# Patient Record
Sex: Female | Born: 1960 | Race: Black or African American | Smoking: Never smoker
Health system: Southern US, Community
[De-identification: ages and names within clinical notes are randomized; demographics above are authoritative.]

## PROBLEM LIST (undated history)

## (undated) DIAGNOSIS — M6250 Muscle wasting and atrophy, not elsewhere classified, unspecified site: Secondary | ICD-10-CM

## (undated) DIAGNOSIS — M5416 Radiculopathy, lumbar region: Secondary | ICD-10-CM

## (undated) DIAGNOSIS — M5136 Other intervertebral disc degeneration, lumbar region: Secondary | ICD-10-CM

## (undated) DIAGNOSIS — M5412 Radiculopathy, cervical region: Secondary | ICD-10-CM

## (undated) DIAGNOSIS — F319 Bipolar disorder, unspecified: Secondary | ICD-10-CM

## (undated) DIAGNOSIS — F32A Depression, unspecified: Secondary | ICD-10-CM

## (undated) DIAGNOSIS — M545 Low back pain, unspecified: Secondary | ICD-10-CM

## (undated) DIAGNOSIS — M25561 Pain in right knee: Secondary | ICD-10-CM

## (undated) DIAGNOSIS — J45909 Unspecified asthma, uncomplicated: Secondary | ICD-10-CM

## (undated) DIAGNOSIS — G8929 Other chronic pain: Secondary | ICD-10-CM

## (undated) DIAGNOSIS — M79671 Pain in right foot: Secondary | ICD-10-CM

## (undated) DIAGNOSIS — I10 Essential (primary) hypertension: Secondary | ICD-10-CM

## (undated) HISTORY — DX: Radiculopathy, cervical region: M54.12

## (undated) HISTORY — DX: Other chronic pain: G89.29

## (undated) HISTORY — DX: Pain in right foot: M79.671

## (undated) HISTORY — DX: Other intervertebral disc degeneration, lumbar region: M51.36

## (undated) HISTORY — PX: MYOMECTOMY: SHX85

## (undated) HISTORY — DX: Muscle wasting and atrophy, not elsewhere classified, unspecified site: M62.50

## (undated) HISTORY — PX: ROTATOR CUFF REPAIR: SHX139

## (undated) HISTORY — DX: Radiculopathy, lumbar region: M54.16

## (undated) HISTORY — PX: TONSILLECTOMY: SUR1361

## (undated) HISTORY — DX: Pain in right knee: M25.561

## (undated) HISTORY — DX: Depression, unspecified: F32.A

## (undated) HISTORY — DX: Low back pain, unspecified: M54.50

---

## 2012-01-22 DIAGNOSIS — F319 Bipolar disorder, unspecified: Secondary | ICD-10-CM | POA: Insufficient documentation

## 2012-01-22 DIAGNOSIS — J069 Acute upper respiratory infection, unspecified: Secondary | ICD-10-CM | POA: Insufficient documentation

## 2012-01-22 DIAGNOSIS — I10 Essential (primary) hypertension: Secondary | ICD-10-CM | POA: Insufficient documentation

## 2012-01-22 DIAGNOSIS — R600 Localized edema: Secondary | ICD-10-CM | POA: Insufficient documentation

## 2012-08-06 DIAGNOSIS — N920 Excessive and frequent menstruation with regular cycle: Secondary | ICD-10-CM | POA: Insufficient documentation

## 2016-05-17 DIAGNOSIS — M2550 Pain in unspecified joint: Secondary | ICD-10-CM | POA: Insufficient documentation

## 2016-10-20 DIAGNOSIS — R072 Precordial pain: Secondary | ICD-10-CM | POA: Insufficient documentation

## 2017-03-12 DIAGNOSIS — F4310 Post-traumatic stress disorder, unspecified: Secondary | ICD-10-CM | POA: Insufficient documentation

## 2017-09-24 DIAGNOSIS — F324 Major depressive disorder, single episode, in partial remission: Secondary | ICD-10-CM | POA: Insufficient documentation

## 2017-12-31 DIAGNOSIS — D259 Leiomyoma of uterus, unspecified: Secondary | ICD-10-CM | POA: Insufficient documentation

## 2017-12-31 DIAGNOSIS — N9489 Other specified conditions associated with female genital organs and menstrual cycle: Secondary | ICD-10-CM | POA: Insufficient documentation

## 2018-10-09 DIAGNOSIS — M5136 Other intervertebral disc degeneration, lumbar region: Secondary | ICD-10-CM | POA: Insufficient documentation

## 2019-04-08 DIAGNOSIS — E669 Obesity, unspecified: Secondary | ICD-10-CM | POA: Insufficient documentation

## 2019-09-10 DIAGNOSIS — M79671 Pain in right foot: Secondary | ICD-10-CM | POA: Insufficient documentation

## 2019-10-05 DIAGNOSIS — R6883 Chills (without fever): Secondary | ICD-10-CM | POA: Insufficient documentation

## 2019-10-05 DIAGNOSIS — R0602 Shortness of breath: Secondary | ICD-10-CM | POA: Insufficient documentation

## 2019-10-05 DIAGNOSIS — R52 Pain, unspecified: Secondary | ICD-10-CM | POA: Insufficient documentation

## 2019-10-08 DIAGNOSIS — M5416 Radiculopathy, lumbar region: Secondary | ICD-10-CM | POA: Insufficient documentation

## 2019-12-14 DIAGNOSIS — G8929 Other chronic pain: Secondary | ICD-10-CM | POA: Insufficient documentation

## 2020-06-28 DIAGNOSIS — E559 Vitamin D deficiency, unspecified: Secondary | ICD-10-CM | POA: Insufficient documentation

## 2020-06-28 DIAGNOSIS — E7800 Pure hypercholesterolemia, unspecified: Secondary | ICD-10-CM | POA: Insufficient documentation

## 2020-07-19 DIAGNOSIS — M6250 Muscle wasting and atrophy, not elsewhere classified, unspecified site: Secondary | ICD-10-CM | POA: Insufficient documentation

## 2020-08-10 ENCOUNTER — Other Ambulatory Visit: Payer: Self-pay | Admitting: Orthopedic Surgery

## 2020-08-10 DIAGNOSIS — M5416 Radiculopathy, lumbar region: Secondary | ICD-10-CM

## 2020-08-11 ENCOUNTER — Telehealth: Payer: Self-pay

## 2020-08-11 NOTE — Telephone Encounter (Signed)
Called and spoke with patient regarding the order we received at [facility] to do a Discogram. Allergies reviewed. Pt denies any allergies to antibiotics. Pt reports she weighs 235 lbs. Pt also reports she is not on any blood thinners. Tye Maryland, our scheduler, will call patient to schedule.

## 2020-08-16 ENCOUNTER — Ambulatory Visit
Admission: RE | Admit: 2020-08-16 | Discharge: 2020-08-16 | Disposition: A | Discharge status: Home / Self Care | Payer: No Typology Code available for payment source | Source: Ambulatory Visit | Attending: Orthopedic Surgery | Admitting: Orthopedic Surgery

## 2020-08-16 DIAGNOSIS — M5416 Radiculopathy, lumbar region: Secondary | ICD-10-CM

## 2020-08-16 MED ORDER — METHYLPREDNISOLONE ACETATE 40 MG/ML INJ SUSP (RADIOLOG
120.0000 mg | Freq: Once | INTRAMUSCULAR | Status: AC
  Administered 2020-08-16: 120 mg via INTRALESIONAL

## 2020-08-16 MED ORDER — IOPAMIDOL (ISOVUE-M 200) INJECTION 41%
1.0000 mL | Freq: Once | INTRAMUSCULAR | Status: AC
  Administered 2020-08-16: 1 mL

## 2020-08-16 MED ORDER — CEFAZOLIN SODIUM-DEXTROSE 2-4 GM/100ML-% IV SOLN
2.0000 g | INTRAVENOUS | Status: AC
  Administered 2020-08-16: 2 g via INTRAVENOUS

## 2020-08-16 NOTE — Discharge Instructions (Signed)
Intra-Discal Anesthetic Injection Discharge Instruction Sheet  1. You may resume a regular diet and any medications that you routinely take (including pain medications).  2. No driving day of procedure.  3. Light activity throughout the rest of the day.  Do not do any strenuous work, exercise, bending or lifting.  The day following the procedure, you can resume normal physical activity but you should refrain from exercising or physical therapy for at least three days thereafter.   Please contact our office at [phone] for the following symptoms:  Fever greater than 100 degrees. Headaches unresolved with medication after 2-3 days.Post Procedure Spinal Discharge Instruction Sheet  You may resume a regular diet and any medications that you routinely take (including pain medications).  No driving day of procedure.  Light activity throughout the rest of the day.  Do not do any strenuous work, exercise, bending or lifting.  The day following the procedure, you can resume normal physical activity but you should refrain from exercising or physical therapy for at least three days thereafter.   Common Side Effects:  Headaches- take your usual medications as directed by your physician.  Increase your fluid intake.  Caffeinated beverages may be helpful.  Lie flat in bed until your headache resolves.  Restlessness or inability to sleep- you may have trouble sleeping for the next few days.  Ask your referring physician if you need any medication for sleep.  Facial flushing or redness- should subside within a few days.  Increased pain- a temporary increase in pain a day or two following your procedure is not unusual.  Take your pain medication as prescribed by your referring physician.  Leg cramps  Please contact our office at [phone] for the following symptoms: Fever greater than 100 degrees. Headaches unresolved with medication after 2-3 days.  Increased swelling, pain, or redness at  injection site.

## 2020-09-16 ENCOUNTER — Ambulatory Visit: Payer: Self-pay | Admitting: Orthopedic Surgery

## 2020-09-26 ENCOUNTER — Other Ambulatory Visit: Payer: Self-pay

## 2020-10-03 ENCOUNTER — Ambulatory Visit: Payer: Self-pay | Admitting: Orthopedic Surgery

## 2020-10-03 NOTE — H&P (Signed)
Subjective:   Joanna Sweeney is a pleasant 60 year old female who was in her normal state of health until unfortunately she was involved in a worker comp injury On 06/17/2020. Her chief complaint is low back pain. Thus far she has failed appropriate conservative treatment. Recent L5-S1 intradiscal injection gave her greater than 80% relief of her symptoms For a short period of time essentially identifying this disc as her primary pain generator. After discussing risks and benefits of surgical intervention, she has elected to move forward with surgery she is scheduled for ALIF L5-S1, Left Iliac crest bone graft. However, after further consideration Dr. Rolena Infante has decided to add posterior pedicle screw fixation for additional support given the patient's body habitus and size. We will still plan on moving forward with the surgery at [facility] on 02/10.  Patient Active Problem List   Diagnosis Date Noted  . Muscle atrophy 07/19/2020  . Pure hypercholesterolemia 06/28/2020  . Vitamin D deficiency 06/28/2020  . Chronic ankle pain 12/14/2019  . Lumbar radiculopathy 10/08/2019  . Chills 10/05/2019  . Generalized body aches 10/05/2019  . Shortness of breath 10/05/2019  . Pain in right foot 09/10/2019  . Obesity (BMI 30-39.9) 04/08/2019  . Degeneration of lumbar intervertebral disc 10/09/2018  . Endometrial mass 12/31/2017  . Uterine leiomyoma 12/31/2017  . Major depressive disorder with single episode, in partial remission ([place]) 09/24/2017  . Posttraumatic stress disorder 03/12/2017  . Precordial chest pain 10/20/2016  . Arthralgia 05/17/2016  . Menorrhagia 08/06/2012  . Benign essential HTN 01/22/2012  . Bipolar disorder ([place]) 01/22/2012  . Edema of lower extremity 01/22/2012  . URI (upper respiratory infection) 01/22/2012   Past Medical History:  Diagnosis Date  . Cervical radiculopathy   . Chronic ankle pain   . Degeneration of lumbar intervertebral disc   . Lumbar radiculopathy   . Lumbar spine  pain   . Muscle atrophy   . Pain in right foot   . Pain in right knee      Current Outpatient Medications  Medication Sig Dispense Refill Last Dose  . albuterol (ACCUNEB) 0.63 MG/3ML nebulizer solution Take 1 ampule by nebulization every 6 (six) hours as needed for wheezing.     Marland Kitchen albuterol (VENTOLIN HFA) 108 (90 Base) MCG/ACT inhaler albuterol sulfate HFA 90 mcg/actuation aerosol inhaler     . amoxicillin (AMOXIL) 500 MG tablet Take 1,000 mg by mouth 2 (two) times daily.     . budesonide-formoterol (SYMBICORT) 80-4.5 MCG/ACT inhaler Inhale 2 puffs into the lungs 2 (two) times daily.     Marland Kitchen buPROPion (WELLBUTRIN XL) 150 MG 24 hr tablet Take 150 mg by mouth 2 (two) times daily.     . cloNIDine (CATAPRES) 0.1 MG tablet Take by mouth.     . diazepam (VALIUM) 5 MG tablet Take 5 mg by mouth every 6 (six) hours as needed for anxiety.     . diclofenac (VOLTAREN) 75 MG EC tablet Take by mouth.     . doxepin (SINEQUAN) 10 MG capsule Take 10 mg by mouth.     . gabapentin (NEURONTIN) 300 MG capsule Take 300 mg by mouth 3 (three) times daily.     . hydrochlorothiazide (HYDRODIURIL) 25 MG tablet Take by mouth.     . lamoTRIgine (LAMICTAL) 100 MG tablet Take 100 mg by mouth daily.     Marland Kitchen lidocaine (LIDODERM) 5 % Place 1 patch onto the skin daily. Remove & Discard patch within 12 hours or as directed by MD     .  methocarbamol (ROBAXIN) 500 MG tablet Take 500 mg by mouth 4 (four) times daily.     . misoprostol (CYTOTEC) 200 MCG tablet Take 200 mcg by mouth 4 (four) times daily.     . Multiple Vitamin (THERA) TABS Take 1 tablet by mouth daily.     . Phentermine-Topiramate (QSYMIA) 7.5-46 MG CP24 Qsymia 7.5 mg-46 mg capsule, extended release  TAKE 1 CAPSULE BY MOUTH EVERY DAY     . promethazine (PHENERGAN) 25 MG tablet Take 25 mg by mouth every 6 (six) hours as needed for nausea or vomiting.     . sertraline (ZOLOFT) 100 MG tablet Take 100 mg by mouth daily.     . traZODone (DESYREL) 100 MG tablet Take by  mouth.     . Vitamin D, Ergocalciferol, (DRISDOL) 1.25 MG (50000 UNIT) CAPS capsule Take 50,000 Units by mouth every 7 (seven) days.      No current facility-administered medications for this visit.   No Known Allergies  Social History   Tobacco Use  . Smoking status: Not on file  . Smokeless tobacco: Not on file  Substance Use Topics  . Alcohol use: Not on file    No family history on file.  Review of Systems Pertinent items are noted in HPI.  Objective:   Vitals: Ht: 6 ft 10/03/2020 01:28 pm BP: 130/87 L arm 10/03/2020 01:31 pm Pulse: 93 bpm 10/03/2020 01:31 pm  Clinical exam: Patient is alert and oriented 3. No shortness of breath, chest pain.  Heart: Regular rate and rhythm, no rubs, murmurs, or gallops  Lungs: Clear to auscultation bilaterally  Abdomen: Soft and nontender. No loss of bowel and bladder control, no rebound tenderness  Lumbar spine: Significant low back pain with palpation and range of motion especially forward flexion and rotation. No SI joint pain with direct palpation. Negative Patrick's test.  Neuro: 5/5 motor strength in the lower extremity bilaterally. Occasional dysesthesias into the lower extremity. Negative straight leg raise test  Imaging studies: Lumbar MRI: completed on 07/06/20 was reviewed with the patient. [facility] was completed at [facility]; I have independently reviewed the images as well as the radiology report. Degenerative lumbar disc disease L5-S1 with a left-sided disc protrusion causing moderate displacement of the descending left S1 nerve root. Positive endplate signal changes noted. No fracture or abnormal marrow signal change. There is significant atrophy within the multifidus muscles L2-S1.  Patient reports temporary significant improvement following the L5-S1 intradiscal injection. She states her pain went from a 9 to a 4/5.  Assessment:   At this point time we have had a discussion about her chronic pain. She is had this issue  for more than 6 months and it is significantly impacting her quality-of-life. Based on the intradiscal injection I believe her primary issue is the degenerative disc disease L5-S1. While she does have a posterior lateral disc protrusion she is not complaining of significant S1 radicular pain, nor does she have any focal neurological deficits.  Plan:   Treatment plan: At this point time the only treatment option I have left offer would be an L5-S1 fusion. This would remove the painful disc and reduce her back pain. I have gone over the surgical procedure in great detail with the patient and her mother and all their questions were encouraged and addressed.  Risks and benefits of surgery were discussed with the patient. These include: Infection, bleeding, death, stroke, paralysis, ongoing or worse pain, need for additional surgery, nonunion, leak of spinal fluid, adjacent segment  degeneration requiring additional fusion surgery, need for posterior decompression and/or fusion. Bleeding from major vessels, and blood clots (deep venous thrombosis)requiring additional treatment, loss in bowel and bladder control, injury to bladder, ureters, and other major abdominal organs.  We have received preoperative medical clearance from the patient's primary care provider who is recommending the patient be seen by cardiologist which is scheduled for February 7. Patient is also scheduled to see Dr. Tawni Millers on February 7. Patient does have her LSO brace which was provided by work comp.  I have reviewed the patient's medication list. She is not on any blood thinners. She is not taking the aspirin or using any anti-inflammatory medications. I have advised her to avoid over-the-counter anti-inflammatory medications one week prior to surgery as well as 6 weeks out.  We have also discussed the post-operative recovery period to include: bathing/showering restrictions, wound healing, activity (and driving) restrictions,  medications/pain mangement.  We have also discussed post-operative redflags to include: signs and symptoms of postoperative infection, DVT/PE.  Follow-up: 2 weeks postop    Addendum: Patient was scheduled for an anterior L5/S1 fusion. Due to her body habitus and size I elected to move forward with an anterior L5-S1 fusion with supplemental posterior pedicle screw fixation in order to improve her overall fusion rate. I have gone over the surgical procedure with the patient and her son and all their questions were addressed. We have addressed the risks benefits and alternatives. Risks and benefits of surgery were discussed with the patient. These include: Infection, bleeding, death, stroke, paralysis, ongoing or worse pain, need for additional surgery, nonunion, leak of spinal fluid, adjacent segment degeneration requiring additional fusion surgery, need for posterior decompression and/or fusion. Bleeding from major vessels, and blood clots (deep venous thrombosis)requiring additional treatment, loss in bowel and bladder control, injury to bladder, ureters, and other major abdominal organs. Risks and benefits of spinal fusion: Infection, bleeding, death, stroke, paralysis, ongoing or worse pain, need for additional surgery, nonunion, leak of spinal fluid, adjacent segment degeneration requiring additional fusion surgery, Injury to abdominal vessels that can require anterior surgery to stop bleeding. Malposition of the cage and/or pedicle screws that could require additional surgery. Loss of bowel and bladder control. Postoperative hematoma causing neurologic compression that could require urgent or emergent re-operation.

## 2020-10-10 ENCOUNTER — Ambulatory Visit (INDEPENDENT_AMBULATORY_CARE_PROVIDER_SITE_OTHER): Payer: No Typology Code available for payment source | Admitting: Vascular Surgery

## 2020-10-10 ENCOUNTER — Encounter: Payer: Self-pay | Admitting: Vascular Surgery

## 2020-10-10 ENCOUNTER — Other Ambulatory Visit: Payer: Self-pay

## 2020-10-10 VITALS — BP 127/89 | HR 72 | Temp 98.1°F | Ht 72.0 in | Wt 244.2 lb

## 2020-10-10 DIAGNOSIS — M5137 Other intervertebral disc degeneration, lumbosacral region: Secondary | ICD-10-CM

## 2020-10-10 NOTE — Progress Notes (Signed)
[facility] [place]  Patient name: Surya Folden MRN: [id number] DOB: Jun 23, 1961 Sex: female  REASON FOR CONSULT evaluation for anterior exposure for L5-S1 fusion  HPI: Callia Swim is a 60 y.o. female, who is here today for discussion of anterior exposure for L5-S1 fusion with Dr. Rolena Infante.  She is here today with her son.  She has had several year history of progressive lower back and bilateral lower extremity discomfort.  She has failed conservative treatment with the spine injections.  She has been seen in consultation with Dr. Rolena Infante who is recommended anterior fusion and posterior pedicle screws.  Past Medical History:  Diagnosis Date  . Cervical radiculopathy   . Chronic ankle pain   . Degeneration of lumbar intervertebral disc   . Depression   . Lumbar radiculopathy   . Lumbar spine pain   . Muscle atrophy   . Pain in right foot   . Pain in right knee     History reviewed. No pertinent family history.  SOCIAL HISTORY: Social History   Socioeconomic History  . Marital status: Unknown    Spouse name: Not on file  . Number of children: Not on file  . Years of education: Not on file  . Highest education level: Not on file  Occupational History  . Not on file  Tobacco Use  . Smoking status: Never Smoker  . Smokeless tobacco: Never Used  Substance and Sexual Activity  . Alcohol use: Not on file  . Drug use: Not on file  . Sexual activity: Not on file  Other Topics Concern  . Not on file  Social History Narrative  . Not on file   Social Determinants of Health   Financial Resource Strain: Not on file  Food Insecurity: Not on file  Transportation Needs: Not on file  Physical Activity: Not on file  Stress: Not on file  Social Connections: Not on file  Intimate Partner Violence: Not on file    No Known Allergies  Current Outpatient Medications  Medication Sig Dispense Refill  . albuterol (ACCUNEB)  0.63 MG/3ML nebulizer solution Take 1 ampule by nebulization every 6 (six) hours as needed for wheezing.    Marland Kitchen albuterol (VENTOLIN HFA) 108 (90 Base) MCG/ACT inhaler albuterol sulfate HFA 90 mcg/actuation aerosol inhaler    . amoxicillin (AMOXIL) 500 MG tablet Take 1,000 mg by mouth 2 (two) times daily.    Marland Kitchen buPROPion (WELLBUTRIN XL) 150 MG 24 hr tablet Take 150 mg by mouth 2 (two) times daily.    . cloNIDine (CATAPRES) 0.1 MG tablet Take by mouth.    . diazepam (VALIUM) 5 MG tablet Take 5 mg by mouth every 6 (six) hours as needed for anxiety.    . gabapentin (NEURONTIN) 300 MG capsule Take 300 mg by mouth 3 (three) times daily.    . hydrochlorothiazide (HYDRODIURIL) 25 MG tablet Take by mouth.    . lamoTRIgine (LAMICTAL) 100 MG tablet Take 100 mg by mouth daily.    Marland Kitchen lidocaine (LIDODERM) 5 % Place 1 patch onto the skin daily. Remove & Discard patch within 12 hours or as directed by MD    . misoprostol (CYTOTEC) 200 MCG tablet Take 200 mcg by mouth 4 (four) times daily.    . Multiple Vitamin (THERA) TABS Take 1 tablet by mouth daily.    . Phentermine-Topiramate (QSYMIA) 7.5-46 MG CP24 Qsymia 7.5 mg-46 mg capsule, extended release  TAKE 1 CAPSULE BY MOUTH EVERY DAY    .  sertraline (ZOLOFT) 100 MG tablet Take 100 mg by mouth daily.    . traZODone (DESYREL) 100 MG tablet Take by mouth.    . budesonide-formoterol (SYMBICORT) 80-4.5 MCG/ACT inhaler Inhale 2 puffs into the lungs 2 (two) times daily. (Patient not taking: Reported on 10/10/2020)    . diclofenac (VOLTAREN) 75 MG EC tablet Take by mouth. (Patient not taking: Reported on 10/10/2020)    . doxepin (SINEQUAN) 10 MG capsule Take 10 mg by mouth. (Patient not taking: Reported on 10/10/2020)    . methocarbamol (ROBAXIN) 500 MG tablet Take 500 mg by mouth 4 (four) times daily. (Patient not taking: Reported on 10/10/2020)    . promethazine (PHENERGAN) 25 MG tablet Take 25 mg by mouth every 6 (six) hours as needed for nausea or vomiting. (Patient not taking:  Reported on 10/10/2020)    . Vitamin D, Ergocalciferol, (DRISDOL) 1.25 MG (50000 UNIT) CAPS capsule Take 50,000 Units by mouth every 7 (seven) days. (Patient not taking: Reported on 10/10/2020)     No current facility-administered medications for this visit.    REVIEW OF SYSTEMS:  [X]  denotes positive finding, [ ]  denotes negative finding Cardiac  Comments:  Chest pain or chest pressure:    Shortness of breath upon exertion:    Short of breath when lying flat:    Irregular heart rhythm:        Vascular    Pain in calf, thigh, or hip brought on by ambulation:    Pain in feet at night that wakes you up from your sleep:     Blood clot in your veins:    Leg swelling:         Pulmonary    Oxygen at home:    Productive cough:     Wheezing:         Neurologic    Sudden weakness in arms or legs:     Sudden numbness in arms or legs:     Sudden onset of difficulty speaking or slurred speech:    Temporary loss of vision in one eye:     Problems with dizziness:         Gastrointestinal    Blood in stool:     Vomited blood:         Genitourinary    Burning when urinating:     Blood in urine:        Psychiatric    Major depression:  x       Hematologic    Bleeding problems:    Problems with blood clotting too easily:        Skin    Rashes or ulcers:        Constitutional    Fever or chills:      PHYSICAL EXAM: Vitals:   10/10/20 1535  BP: 127/89  Pulse: 72  Temp: 98.1 F (36.7 C)  TempSrc: Skin  SpO2: 95%  Weight: 244 lb 3.2 oz (110.8 kg)  Height: 6' (1.829 m)    GENERAL: The patient is a well-nourished female, in no acute distress. The vital signs are documented above. CARDIOVASCULAR: 2+ radial 2+ popliteal and 2+ dorsalis pedis pulses bilaterally.  Carotid arteries without bruits bilaterally.  Abdomen soft with no masses noted PULMONARY: There is good air exchange  MUSCULOSKELETAL: There are no major deformities or cyanosis. NEUROLOGIC: No focal weakness or  paresthesias are detected. SKIN: There are no ulcers or rashes noted. PSYCHIATRIC: The patient has a normal affect.  DATA:  Discogram images  were reviewed from 08/16/2020.  This showed no evidence of calcification of the aortoiliac segments  MEDICAL ISSUES: Failed conservative treatment of severe degenerative disc disease lower back.  She has seen Dr. Rolena Infante who is recommended fusion for treatment.  I explained my role in exposure.  I explained the location of the incision and retroperitoneal exposure with mobilization of the ureter and intraperitoneal contents.  Also discussed mobilization of the arterial and venous structures overlying the spine with potential of injury of all these structures.  I specifically did discuss the risk of potential major venous injury.  I feel that she is an acceptable risk for surgery. She is not morbidly obese She has had no prior abdominal surgery She has no evidence of aortoiliac occlusive disease  We plan on proceeding on 10/26/2020 as planned   Rosetta Posner, MD [facility] [phone] Pager [phone]  Note: Portions of this report may have been transcribed using voice recognition software.  Every effort has been made to ensure accuracy; however, inadvertent computerized transcription errors may still be present.

## 2020-10-11 ENCOUNTER — Other Ambulatory Visit: Payer: Self-pay

## 2020-10-21 NOTE — Pre-Procedure Instructions (Signed)
Jaeline Whobrey  10/21/2020      [id number] - [place], [place] - [address] [place] [address] Phone: [phone] Fax: [phone]    Your procedure is scheduled on Feb. 23  Report to [facility]  Entrance A at 5:30 A.M.  Call this number if you have problems the morning of surgery:  [phone]   Remember:  Do not eat or drink after midnight.      Take these medicines the morning of surgery with A SIP OF WATER:              Albuterol inhaler if needed             symbicort --bring to hospital             Diazepam (valium) if needed             Doxepin (sinequan)             Methocarbamol (robaxin)             Phenergan if needed            Sertraline (zoloft)                    7 days prior to surgery STOP taking any Aspirin (unless otherwise instructed by your surgeon), Aleve, Naproxen, Ibuprofen, Motrin, Advil, Goody's, BC's, all herbal medications, fish oil, and all vitamins. Stop diclofenac (voltaren)                  Do not wear jewelry, make-up or nail polish.  Do not wear lotions, powders, or perfumes, or deodorant.  Do not shave 48 hours prior to surgery.  Men may shave face and neck.  Do not bring valuables to the hospital.  [facility] is not responsible for any belongings or valuables.  Contacts, dentures or bridgework may not be worn into surgery.  Leave your suitcase in the car.  After surgery it may be brought to your room.  For patients admitted to the hospital, discharge time will be determined by your treatment team.  Patients discharged the day of surgery will not be allowed to drive home.    Special instructions:   [place]- Preparing For Surgery  Before surgery, you can play an important role. Because skin is not sterile, your skin needs to be as free of germs as possible. You can reduce the number of germs on your skin by washing with CHG  (chlorahexidine gluconate) Soap before surgery.  CHG is an antiseptic cleaner which kills germs and bonds with the skin to continue killing germs even after washing.    Oral Hygiene is also important to reduce your risk of infection.  Remember - BRUSH YOUR TEETH THE MORNING OF SURGERY WITH YOUR REGULAR TOOTHPASTE  Please do not use if you have an allergy to CHG or antibacterial soaps. If your skin becomes reddened/irritated stop using the CHG.  Do not shave (including legs and underarms) for at least 48 hours prior to first CHG shower. It is OK to shave your face.  Please follow these instructions carefully.   1. Shower the NIGHT BEFORE SURGERY and the MORNING OF SURGERY with CHG.   2. If you chose to wash your hair, wash your hair first as usual with your normal shampoo.  3. After you shampoo, rinse your hair and body thoroughly to  remove the shampoo.  4. Use CHG as you would any other liquid soap. You can apply CHG directly to the skin and wash gently with a scrungie or a clean washcloth.   5. Apply the CHG Soap to your body ONLY FROM THE NECK DOWN.  Do not use on open wounds or open sores. Avoid contact with your eyes, ears, mouth and genitals (private parts). Wash Face and genitals (private parts)  with your normal soap.  6. Wash thoroughly, paying special attention to the area where your surgery will be performed.  7. Thoroughly rinse your body with warm water from the neck down.  8. DO NOT shower/wash with your normal soap after using and rinsing off the CHG Soap.  9. Pat yourself dry with a CLEAN TOWEL.  10. Wear CLEAN PAJAMAS to bed the night before surgery, wear comfortable clothes the morning of surgery  11. Place CLEAN SHEETS on your bed the night of your first shower and DO NOT SLEEP WITH PETS.    Day of Surgery:  Do not apply any deodorants/lotions.  Please wear clean clothes to the hospital/surgery center.   Remember to brush your teeth WITH YOUR REGULAR  TOOTHPASTE.    Please read over the following fact sheets that you were given.

## 2020-10-24 ENCOUNTER — Encounter (HOSPITAL_COMMUNITY)
Admission: RE | Admit: 2020-10-24 | Discharge: 2020-10-24 | Disposition: A | Discharge status: Home / Self Care | Payer: No Typology Code available for payment source | Source: Ambulatory Visit | Attending: Orthopedic Surgery | Admitting: Orthopedic Surgery

## 2020-10-24 ENCOUNTER — Other Ambulatory Visit (HOSPITAL_COMMUNITY): Payer: BC Managed Care – PPO

## 2020-10-24 ENCOUNTER — Other Ambulatory Visit: Payer: Self-pay

## 2020-10-24 ENCOUNTER — Encounter (HOSPITAL_COMMUNITY): Payer: Self-pay

## 2020-10-24 ENCOUNTER — Ambulatory Visit (HOSPITAL_COMMUNITY)
Admission: RE | Admit: 2020-10-24 | Discharge: 2020-10-24 | Disposition: A | Discharge status: Home / Self Care | Payer: No Typology Code available for payment source | Source: Ambulatory Visit | Attending: Orthopedic Surgery | Admitting: Orthopedic Surgery

## 2020-10-24 DIAGNOSIS — Z01818 Encounter for other preprocedural examination: Secondary | ICD-10-CM

## 2020-10-24 HISTORY — DX: Essential (primary) hypertension: I10

## 2020-10-24 HISTORY — DX: Unspecified asthma, uncomplicated: J45.909

## 2020-10-24 HISTORY — DX: Bipolar disorder, unspecified: F31.9

## 2020-10-24 LAB — CBC
HCT: 38.8 % (ref 36.0–46.0)
Hemoglobin: 12.1 g/dL (ref 12.0–15.0)
MCH: 30 pg (ref 26.0–34.0)
MCHC: 31.2 g/dL (ref 30.0–36.0)
MCV: 96.3 fL (ref 80.0–100.0)
Platelets: 241 10*3/uL (ref 150–400)
RBC: 4.03 MIL/uL (ref 3.87–5.11)
RDW: 12.3 % (ref 11.5–15.5)
WBC: 6.1 10*3/uL (ref 4.0–10.5)
nRBC: 0 % (ref 0.0–0.2)

## 2020-10-24 LAB — BASIC METABOLIC PANEL
Anion gap: 8 (ref 5–15)
BUN: 6 mg/dL (ref 6–20)
CO2: 29 mmol/L (ref 22–32)
Calcium: 9.1 mg/dL (ref 8.9–10.3)
Chloride: 104 mmol/L (ref 98–111)
Creatinine, Ser: 0.88 mg/dL (ref 0.44–1.00)
GFR, Estimated: >60 mL/min (ref >60)
Glucose, Bld: 105 mg/dL — ABNORMAL HIGH (ref 70–99)
Potassium: 3.6 mmol/L (ref 3.5–5.1)
Sodium: 141 mmol/L (ref 135–145)

## 2020-10-24 LAB — TYPE AND SCREEN
ABO/RH(D): B POS
Antibody Screen: NEGATIVE

## 2020-10-24 LAB — APTT: aPTT: 27 seconds (ref 24–36)

## 2020-10-24 LAB — URINALYSIS, ROUTINE W REFLEX MICROSCOPIC
Bilirubin Urine: NEGATIVE
Glucose, UA: NEGATIVE mg/dL
Hgb urine dipstick: NEGATIVE
Ketones, ur: NEGATIVE mg/dL
Leukocytes,Ua: NEGATIVE
Nitrite: NEGATIVE
Protein, ur: NEGATIVE mg/dL
Specific Gravity, Urine: 1.012 (ref 1.005–1.030)
pH: 5 (ref 5.0–8.0)

## 2020-10-24 LAB — SURGICAL PCR SCREEN
MRSA, PCR: NEGATIVE
Staphylococcus aureus: NEGATIVE

## 2020-10-24 LAB — PROTIME-INR
INR: 1 (ref 0.8–1.2)
Prothrombin Time: 13 seconds (ref 11.4–15.2)

## 2020-10-24 NOTE — Progress Notes (Signed)
PCP - [facility]  Cardiologist - Dr. Jenny Reichmann Powers  Chest x-ray - 10/24/20  EKG - 10/10/20 (CE)-req'd  Stress Test - 10/20/20 (CE)  ECHO - 10/18/20 (CE)  Cardiac Cath - Denies  AICD-na PM-na LOOP-na  Sleep Study - Denies CPAP - Denies  LABS- 10/24/20: CBC, BMP, PT, PTT, T/S, UA, PCR, COVID  ASA- Denies  ERAS- No  HA1C- Denies  Anesthesia- Yes- surgical order/ EKG req'd  Pt denies having chest pain, sob, or fever at this time. All instructions explained to the pt, with a verbal understanding of the material. Pt agrees to go over the instructions while at home for a better understanding. Pt also instructed to self quarantine after being tested for COVID-19. The opportunity to ask questions was provided.   Coronavirus Screening  Have you experienced the following symptoms:  Cough yes/no: No Fever (>100.34F)  yes/no: No Runny nose yes/no: No Sore throat yes/no: No Difficulty breathing/shortness of breath  yes/no: No  Have you or a family member traveled in the last 14 days and where? yes/no: No   If the patient indicates "YES" to the above questions, their PAT will be rescheduled to limit the exposure to others and, the surgeon will be notified. THE PATIENT WILL NEED TO BE ASYMPTOMATIC FOR 14 DAYS.   If the patient is not experiencing any of these symptoms, the PAT nurse will instruct them to NOT bring anyone with them to their appointment since they may have these symptoms or traveled as well.   Please remind your patients and families that hospital visitation restrictions are in effect and the importance of the restrictions.

## 2020-10-25 ENCOUNTER — Other Ambulatory Visit (HOSPITAL_COMMUNITY)
Admission: RE | Admit: 2020-10-25 | Discharge: 2020-10-25 | Disposition: A | Discharge status: Home / Self Care | Payer: No Typology Code available for payment source | Source: Ambulatory Visit | Attending: Orthopedic Surgery | Admitting: Orthopedic Surgery

## 2020-10-25 ENCOUNTER — Ambulatory Visit: Payer: Self-pay | Admitting: Orthopedic Surgery

## 2020-10-25 DIAGNOSIS — Z01812 Encounter for preprocedural laboratory examination: Secondary | ICD-10-CM | POA: Diagnosis present

## 2020-10-25 DIAGNOSIS — Z20822 Contact with and (suspected) exposure to covid-19: Secondary | ICD-10-CM | POA: Insufficient documentation

## 2020-10-25 LAB — SARS CORONAVIRUS 2 (TAT 6-24 HRS): SARS Coronavirus 2: NEGATIVE

## 2020-10-25 NOTE — Progress Notes (Signed)
Anesthesia Chart Review:  Recent cardiology evaluation by Dr. Prince Rome at [facility] for hypertension and DOE.  He ordered echocardiogram and nuclear stress and advised that if these were normal she could proceed with back surgery without undue cardiac risk.  Nuclear stress 10/20/2020 was nonischemic, EF 56%, normal wall motion.  Echo 10/18/2020 showed EF of 55-60%, normal valves.  Preop labs reviewed, unremarkable.  EKG 09/27/2020 (Care Everywhere, narrative only, tracing requested): Normal sinus rhythm  Nuclear stress 10/20/2020 (Care Everywhere): Impression:  Myocardial perfusion images reveal a small in size fixed moderate intensity anteroseptal perfusion abnormality. This is most consistent with attenuation artifact.  Gated wall motion study revealed an EF of 56% with no regional wall motion abnormality  TTE 10/18/2020 (Care Everywhere): Left Ventricle  Normal left ventricular size. Wall thickness is normal. EF: 55-60%. ; GLS = -18.900% from the apical 4,3,2 chamber views respectively. Wall motion is normal. Doppler parameters indicate normal diastolic function.   Right Ventricle  Right ventricle is normal. Systolic function is normal.   Left Atrium  Left atrium is mildly dilated.   Right Atrium  Normal sized right atrium.   Mitral Valve  Mitral valve structure is normal. There is trace regurgitation.   Tricuspid Valve  Tricuspid valve structure is normal. There is trace regurgitation. The right ventricular systolic pressure is normal (<36 mmHg).   Aortic Valve  The aortic valve is tricuspid. The leaflets are not thickened and exhibit normal excursion. There is no regurgitation or stenosis.   Pulmonic Valve  The pulmonic valve was not well visualized. Trace regurgitation.   Ascending Aorta  The aortic root is normal in size.   Pericardium  There is no pericardial effusion.   Study Details  A complete echo was performed using complete 2D, color flow Doppler, spectral Doppler  and strain. Overall the study quality was adequate.    Wynonia Musty [facility] Short Stay Center/Anesthesiology Phone [phone] 10/25/2020 9:31 AM

## 2020-10-25 NOTE — Anesthesia Preprocedure Evaluation (Addendum)
Anesthesia Evaluation  Patient identified by MRN, date of birth, ID band Patient awake    Reviewed: Allergy & Precautions, NPO status , Patient's Chart, lab work & pertinent test results  History of Anesthesia Complications Negative for: history of anesthetic complications  Airway Mallampati: II  TM Distance: >3 FB Neck ROM: Full    Dental  (+) Partial Lower, Dental Advisory Given   Pulmonary asthma , pneumonia,    Pulmonary exam normal        Cardiovascular hypertension, Pt. on medications Normal cardiovascular exam  Nuclear stress 10/20/2020 was nonischemic, EF 56%, normal wall motion.  Echo 10/18/2020 showed EF of 55-60%, normal valves.    Neuro/Psych PSYCHIATRIC DISORDERS Anxiety Depression Bipolar Disorder negative neurological ROS     GI/Hepatic negative GI ROS, Neg liver ROS,   Endo/Other  negative endocrine ROS  Renal/GU negative Renal ROS     Musculoskeletal negative musculoskeletal ROS (+)   Abdominal   Peds  Hematology negative hematology ROS (+)   Anesthesia Other Findings   Reproductive/Obstetrics                           Anesthesia Physical Anesthesia Plan  ASA: II  Anesthesia Plan: General   Post-op Pain Management:    Induction: Intravenous  PONV Risk Score and Plan: 4 or greater and Ondansetron, Dexamethasone, Midazolam and Scopolamine patch - Pre-op  Airway Management Planned: Oral ETT  Additional Equipment: Arterial line  Intra-op Plan:   Post-operative Plan: Extubation in OR  Informed Consent: I have reviewed the patients History and Physical, chart, labs and discussed the procedure including the risks, benefits and alternatives for the proposed anesthesia with the patient or authorized representative who has indicated his/her understanding and acceptance.     Dental advisory given  Plan Discussed with: Anesthesiologist and CRNA  Anesthesia Plan  Comments: (PAT note by Karoline Caldwell, PA-C: Recent cardiology evaluation by Dr. Prince Rome at [facility] for hypertension and DOE.  He ordered echocardiogram and nuclear stress and advised that if these were normal she could proceed with back surgery without undue cardiac risk.  Nuclear stress 10/20/2020 was nonischemic, EF 56%, normal wall motion.  Echo 10/18/2020 showed EF of 55-60%, normal valves.  Preop labs reviewed, unremarkable.  EKG 09/27/2020 (Care Everywhere, narrative only, tracing requested): Normal sinus rhythm  Nuclear stress 10/20/2020 (Care Everywhere): Impression:  Myocardial perfusion images reveal a small in size fixed moderate intensity anteroseptal perfusion abnormality. This is most consistent with attenuation artifact.  Gated wall motion study revealed an EF of 56% with no regional wall motion abnormality  TTE 10/18/2020 (Care Everywhere): Left Ventricle  Normal left ventricular size. Wall thickness is normal. EF: 55-60%. ; GLS = -18.900% from the apical 4,3,2 chamber views respectively. Wall motion is normal. Doppler parameters indicate normal diastolic function.   Right Ventricle  Right ventricle is normal. Systolic function is normal.   Left Atrium  Left atrium is mildly dilated.   Right Atrium  Normal sized right atrium.   Mitral Valve  Mitral valve structure is normal. There is trace regurgitation.   Tricuspid Valve  Tricuspid valve structure is normal. There is trace regurgitation. The right ventricular systolic pressure is normal (<36 mmHg).   Aortic Valve  The aortic valve is tricuspid. The leaflets are not thickened and exhibit normal excursion. There is no regurgitation or stenosis.   Pulmonic Valve  The pulmonic valve was not well visualized. Trace regurgitation.   Ascending Aorta  The  aortic root is normal in size.   Pericardium  There is no pericardial effusion.   Study Details  A complete echo was performed using complete 2D, color flow Doppler,  spectral Doppler and strain. Overall the study quality was adequate.)      Anesthesia Quick Evaluation

## 2020-10-26 ENCOUNTER — Inpatient Hospital Stay (HOSPITAL_COMMUNITY): Payer: No Typology Code available for payment source

## 2020-10-26 ENCOUNTER — Inpatient Hospital Stay (HOSPITAL_COMMUNITY)
Admission: RE | Disposition: A | Discharge status: Home / Self Care | Payer: Self-pay | Source: Home / Self Care | Attending: Orthopedic Surgery

## 2020-10-26 ENCOUNTER — Other Ambulatory Visit: Payer: Self-pay

## 2020-10-26 ENCOUNTER — Inpatient Hospital Stay (HOSPITAL_COMMUNITY): Payer: No Typology Code available for payment source | Admitting: Physician Assistant

## 2020-10-26 ENCOUNTER — Encounter (HOSPITAL_COMMUNITY): Payer: Self-pay | Admitting: Orthopedic Surgery

## 2020-10-26 ENCOUNTER — Inpatient Hospital Stay (HOSPITAL_COMMUNITY): Payer: No Typology Code available for payment source | Admitting: Certified Registered Nurse Anesthetist

## 2020-10-26 ENCOUNTER — Inpatient Hospital Stay (HOSPITAL_COMMUNITY)
Admission: RE | Admit: 2020-10-26 | Discharge: 2020-10-28 | DRG: 455 | Disposition: A | Discharge status: Home / Self Care | Payer: No Typology Code available for payment source | Attending: Orthopedic Surgery | Admitting: Orthopedic Surgery

## 2020-10-26 DIAGNOSIS — M5416 Radiculopathy, lumbar region: Secondary | ICD-10-CM | POA: Diagnosis present

## 2020-10-26 DIAGNOSIS — M5137 Other intervertebral disc degeneration, lumbosacral region: Principal | ICD-10-CM | POA: Diagnosis present

## 2020-10-26 DIAGNOSIS — Z981 Arthrodesis status: Secondary | ICD-10-CM

## 2020-10-26 DIAGNOSIS — M5412 Radiculopathy, cervical region: Secondary | ICD-10-CM | POA: Diagnosis present

## 2020-10-26 DIAGNOSIS — M5136 Other intervertebral disc degeneration, lumbar region: Secondary | ICD-10-CM | POA: Diagnosis present

## 2020-10-26 DIAGNOSIS — Z9889 Other specified postprocedural states: Secondary | ICD-10-CM | POA: Diagnosis not present

## 2020-10-26 DIAGNOSIS — Z419 Encounter for procedure for purposes other than remedying health state, unspecified: Secondary | ICD-10-CM

## 2020-10-26 DIAGNOSIS — Z79899 Other long term (current) drug therapy: Secondary | ICD-10-CM

## 2020-10-26 DIAGNOSIS — G8929 Other chronic pain: Secondary | ICD-10-CM | POA: Diagnosis present

## 2020-10-26 DIAGNOSIS — F319 Bipolar disorder, unspecified: Secondary | ICD-10-CM | POA: Diagnosis present

## 2020-10-26 DIAGNOSIS — M5417 Radiculopathy, lumbosacral region: Secondary | ICD-10-CM | POA: Diagnosis not present

## 2020-10-26 DIAGNOSIS — I10 Essential (primary) hypertension: Secondary | ICD-10-CM | POA: Diagnosis present

## 2020-10-26 DIAGNOSIS — M79605 Pain in left leg: Secondary | ICD-10-CM | POA: Diagnosis present

## 2020-10-26 HISTORY — PX: ABDOMINAL EXPOSURE: SHX5708

## 2020-10-26 HISTORY — PX: ANTERIOR LUMBAR FUSION: SHX1170

## 2020-10-26 LAB — CBC
HCT: 34.6 % — ABNORMAL LOW (ref 36.0–46.0)
Hemoglobin: 11.4 g/dL — ABNORMAL LOW (ref 12.0–15.0)
MCH: 31.1 pg (ref 26.0–34.0)
MCHC: 32.9 g/dL (ref 30.0–36.0)
MCV: 94.5 fL (ref 80.0–100.0)
Platelets: 260 10*3/uL (ref 150–400)
RBC: 3.66 MIL/uL — ABNORMAL LOW (ref 3.87–5.11)
RDW: 12.5 % (ref 11.5–15.5)
WBC: 12.6 10*3/uL — ABNORMAL HIGH (ref 4.0–10.5)
nRBC: 0 % (ref 0.0–0.2)

## 2020-10-26 LAB — ABO/RH: ABO/RH(D): B POS

## 2020-10-26 LAB — CREATININE, SERUM
Creatinine, Ser: 0.99 mg/dL (ref 0.44–1.00)
GFR, Estimated: >60 mL/min (ref >60)

## 2020-10-26 SURGERY — ANTERIOR LUMBAR FUSION 1 LEVEL
Anesthesia: Regional

## 2020-10-26 MED ORDER — PHENOL 1.4 % MT LIQD: 1.0000 | OROMUCOSAL | Status: DC | PRN

## 2020-10-26 MED ORDER — METHOCARBAMOL 500 MG PO TABS
500.0000 mg | ORAL_TABLET | Freq: Four times a day (QID) | ORAL | Status: DC | PRN
  Administered 2020-10-26 – 2020-10-28 (×7): 500 mg via ORAL
  Filled 2020-10-26 (×7): qty 1

## 2020-10-26 MED ORDER — PROPOFOL 10 MG/ML IV BOLUS
INTRAVENOUS | Status: AC
  Filled 2020-10-26: qty 20

## 2020-10-26 MED ORDER — ONDANSETRON HCL 4 MG/2ML IJ SOLN: 4.0000 mg | Freq: Four times a day (QID) | INTRAMUSCULAR | Status: DC | PRN

## 2020-10-26 MED ORDER — CHLORHEXIDINE GLUCONATE 4 % EX LIQD: 60.0000 mL | Freq: Once | CUTANEOUS | Status: DC

## 2020-10-26 MED ORDER — CEFAZOLIN SODIUM-DEXTROSE 2-4 GM/100ML-% IV SOLN
INTRAVENOUS | Status: AC
  Filled 2020-10-26: qty 100

## 2020-10-26 MED ORDER — ACETAMINOPHEN 325 MG PO TABS
650.0000 mg | ORAL_TABLET | ORAL | Status: DC | PRN
  Administered 2020-10-26 – 2020-10-28 (×5): 650 mg via ORAL
  Filled 2020-10-26 (×6): qty 2

## 2020-10-26 MED ORDER — FENTANYL CITRATE (PF) 100 MCG/2ML IJ SOLN
25.0000 ug | INTRAMUSCULAR | Status: DC | PRN
  Administered 2020-10-26 (×3): 50 ug via INTRAVENOUS

## 2020-10-26 MED ORDER — PHENYLEPHRINE HCL-NACL 10-0.9 MG/250ML-% IV SOLN
INTRAVENOUS | Status: DC | PRN
  Administered 2020-10-26: 25 ug/min via INTRAVENOUS

## 2020-10-26 MED ORDER — CEFAZOLIN SODIUM 1 G IJ SOLR
INTRAMUSCULAR | Status: AC
  Filled 2020-10-26: qty 20

## 2020-10-26 MED ORDER — KETAMINE HCL 50 MG/5ML IJ SOSY
PREFILLED_SYRINGE | INTRAMUSCULAR | Status: AC
  Filled 2020-10-26: qty 5

## 2020-10-26 MED ORDER — TRAZODONE HCL 100 MG PO TABS
100.0000 mg | ORAL_TABLET | Freq: Every day | ORAL | Status: DC
  Administered 2020-10-26 – 2020-10-27 (×2): 100 mg via ORAL
  Filled 2020-10-26 (×3): qty 1

## 2020-10-26 MED ORDER — SERTRALINE HCL 50 MG PO TABS
100.0000 mg | ORAL_TABLET | Freq: Every day | ORAL | Status: DC
  Administered 2020-10-26 – 2020-10-28 (×3): 100 mg via ORAL
  Filled 2020-10-26 (×3): qty 2

## 2020-10-26 MED ORDER — POLYETHYLENE GLYCOL 3350 17 G PO PACK
17.0000 g | PACK | Freq: Every day | ORAL | Status: DC | PRN
  Administered 2020-10-26: 17 g via ORAL
  Filled 2020-10-26: qty 1

## 2020-10-26 MED ORDER — LABETALOL HCL 5 MG/ML IV SOLN
INTRAVENOUS | Status: DC | PRN
  Administered 2020-10-26: 2.5 mg via INTRAVENOUS

## 2020-10-26 MED ORDER — PROPOFOL 10 MG/ML IV BOLUS
INTRAVENOUS | Status: DC | PRN
  Administered 2020-10-26: 200 mg via INTRAVENOUS

## 2020-10-26 MED ORDER — ACETAMINOPHEN 500 MG PO TABS
1000.0000 mg | ORAL_TABLET | Freq: Once | ORAL | Status: AC
  Administered 2020-10-26: 1000 mg via ORAL
  Filled 2020-10-26: qty 2

## 2020-10-26 MED ORDER — ARTIFICIAL TEARS OPHTHALMIC OINT
TOPICAL_OINTMENT | OPHTHALMIC | Status: AC
  Filled 2020-10-26: qty 3.5

## 2020-10-26 MED ORDER — PROPOFOL 500 MG/50ML IV EMUL
INTRAVENOUS | Status: DC | PRN
  Administered 2020-10-26: 50 ug/kg/min via INTRAVENOUS
  Administered 2020-10-26: 65 ug/kg/min via INTRAVENOUS
  Administered 2020-10-26: 50 ug/kg/min via INTRAVENOUS

## 2020-10-26 MED ORDER — DEXAMETHASONE SODIUM PHOSPHATE 10 MG/ML IJ SOLN
INTRAMUSCULAR | Status: AC
  Filled 2020-10-26: qty 1

## 2020-10-26 MED ORDER — CLONIDINE HCL 0.2 MG PO TABS
0.2000 mg | ORAL_TABLET | Freq: Every day | ORAL | Status: DC
  Administered 2020-10-26 – 2020-10-27 (×2): 0.2 mg via ORAL
  Filled 2020-10-26 (×3): qty 1

## 2020-10-26 MED ORDER — CEFAZOLIN SODIUM-DEXTROSE 2-4 GM/100ML-% IV SOLN
2.0000 g | INTRAVENOUS | Status: AC
  Administered 2020-10-26 (×2): 2 g via INTRAVENOUS

## 2020-10-26 MED ORDER — FENTANYL CITRATE (PF) 100 MCG/2ML IJ SOLN
INTRAMUSCULAR | Status: AC
  Filled 2020-10-26: qty 2

## 2020-10-26 MED ORDER — ROCURONIUM BROMIDE 100 MG/10ML IV SOLN
INTRAVENOUS | Status: DC | PRN
  Administered 2020-10-26: 100 mg via INTRAVENOUS
  Administered 2020-10-26: 30 mg via INTRAVENOUS
  Administered 2020-10-26: 20 mg via INTRAVENOUS

## 2020-10-26 MED ORDER — CHLORHEXIDINE GLUCONATE 0.12 % MT SOLN
15.0000 mL | Freq: Once | OROMUCOSAL | Status: AC
  Administered 2020-10-26: 15 mL via OROMUCOSAL
  Filled 2020-10-26: qty 15

## 2020-10-26 MED ORDER — MORPHINE SULFATE (PF) 2 MG/ML IV SOLN
2.0000 mg | INTRAVENOUS | Status: DC | PRN
  Administered 2020-10-26: 2 mg via INTRAVENOUS
  Filled 2020-10-26: qty 1

## 2020-10-26 MED ORDER — ONDANSETRON HCL 4 MG/2ML IJ SOLN
INTRAMUSCULAR | Status: AC
  Filled 2020-10-26: qty 2

## 2020-10-26 MED ORDER — FLEET ENEMA 7-19 GM/118ML RE ENEM: 1.0000 | ENEMA | Freq: Once | RECTAL | Status: DC | PRN

## 2020-10-26 MED ORDER — ORAL CARE MOUTH RINSE: 15.0000 mL | Freq: Once | OROMUCOSAL | Status: AC

## 2020-10-26 MED ORDER — KETAMINE HCL 10 MG/ML IJ SOLN
INTRAMUSCULAR | Status: DC | PRN
  Administered 2020-10-26: 20 mg via INTRAVENOUS
  Administered 2020-10-26 (×3): 10 mg via INTRAVENOUS

## 2020-10-26 MED ORDER — ONDANSETRON HCL 4 MG PO TABS: 4.0000 mg | ORAL_TABLET | Freq: Three times a day (TID) | ORAL | 0 refills | Status: AC | PRN

## 2020-10-26 MED ORDER — ONDANSETRON HCL 4 MG/2ML IJ SOLN
INTRAMUSCULAR | Status: DC | PRN
  Administered 2020-10-26 (×2): 4 mg via INTRAVENOUS

## 2020-10-26 MED ORDER — DOCUSATE SODIUM 100 MG PO CAPS
100.0000 mg | ORAL_CAPSULE | Freq: Two times a day (BID) | ORAL | Status: DC
  Administered 2020-10-26 – 2020-10-27 (×2): 100 mg via ORAL
  Filled 2020-10-26 (×3): qty 1

## 2020-10-26 MED ORDER — PROPOFOL 1000 MG/100ML IV EMUL
INTRAVENOUS | Status: AC
  Filled 2020-10-26: qty 300

## 2020-10-26 MED ORDER — ALBUTEROL SULFATE HFA 108 (90 BASE) MCG/ACT IN AERS
1.0000 | INHALATION_SPRAY | Freq: Four times a day (QID) | RESPIRATORY_TRACT | Status: DC | PRN
  Filled 2020-10-26: qty 6.7

## 2020-10-26 MED ORDER — OXYCODONE HCL 5 MG PO TABS
10.0000 mg | ORAL_TABLET | ORAL | Status: DC | PRN
  Administered 2020-10-26 – 2020-10-28 (×13): 10 mg via ORAL
  Filled 2020-10-26 (×13): qty 2

## 2020-10-26 MED ORDER — SODIUM CHLORIDE 0.9% FLUSH: 3.0000 mL | INTRAVENOUS | Status: DC | PRN

## 2020-10-26 MED ORDER — ONDANSETRON HCL 4 MG PO TABS: 4.0000 mg | ORAL_TABLET | Freq: Four times a day (QID) | ORAL | Status: DC | PRN

## 2020-10-26 MED ORDER — AMISULPRIDE (ANTIEMETIC) 5 MG/2ML IV SOLN: 10.0000 mg | Freq: Once | INTRAVENOUS | Status: DC | PRN

## 2020-10-26 MED ORDER — OXYCODONE-ACETAMINOPHEN 10-325 MG PO TABS: 1.0000 | ORAL_TABLET | Freq: Four times a day (QID) | ORAL | 0 refills | Status: AC | PRN

## 2020-10-26 MED ORDER — FLUTICASONE FUROATE-VILANTEROL 100-25 MCG/INH IN AEPB
1.0000 | INHALATION_SPRAY | Freq: Every day | RESPIRATORY_TRACT | Status: DC
  Administered 2020-10-28: 11:00:00 1 via RESPIRATORY_TRACT
  Filled 2020-10-26: qty 28

## 2020-10-26 MED ORDER — METHOCARBAMOL 1000 MG/10ML IJ SOLN
500.0000 mg | Freq: Four times a day (QID) | INTRAVENOUS | Status: DC | PRN
  Filled 2020-10-26: qty 5

## 2020-10-26 MED ORDER — ENOXAPARIN SODIUM 40 MG/0.4ML ~~LOC~~ SOLN: 40.0000 mg | SUBCUTANEOUS | 0 refills | Status: AC

## 2020-10-26 MED ORDER — BUPIVACAINE-EPINEPHRINE (PF) 0.25% -1:200000 IJ SOLN
INTRAMUSCULAR | Status: DC | PRN
  Administered 2020-10-26: 11 mL
  Administered 2020-10-26: 10 mL
  Administered 2020-10-26: 9 mL

## 2020-10-26 MED ORDER — LACTATED RINGERS IV SOLN: INTRAVENOUS | Status: DC | PRN

## 2020-10-26 MED ORDER — LACTATED RINGERS IV SOLN: INTRAVENOUS | Status: DC

## 2020-10-26 MED ORDER — HYDROMORPHONE HCL 1 MG/ML IJ SOLN
INTRAMUSCULAR | Status: AC
  Filled 2020-10-26: qty 1

## 2020-10-26 MED ORDER — MIDAZOLAM HCL 5 MG/5ML IJ SOLN
INTRAMUSCULAR | Status: DC | PRN
  Administered 2020-10-26 (×2): 2 mg via INTRAVENOUS

## 2020-10-26 MED ORDER — DEXAMETHASONE SODIUM PHOSPHATE 10 MG/ML IJ SOLN
INTRAMUSCULAR | Status: DC | PRN
  Administered 2020-10-26: 10 mg via INTRAVENOUS

## 2020-10-26 MED ORDER — MENTHOL 3 MG MT LOZG: 1.0000 | LOZENGE | OROMUCOSAL | Status: DC | PRN

## 2020-10-26 MED ORDER — SODIUM CHLORIDE 0.9 % IV SOLN
INTRAVENOUS | Status: AC
  Filled 2020-10-26: qty 1.2

## 2020-10-26 MED ORDER — LIDOCAINE 2% (20 MG/ML) 5 ML SYRINGE
INTRAMUSCULAR | Status: DC | PRN
  Administered 2020-10-26: 100 mg via INTRAVENOUS

## 2020-10-26 MED ORDER — MIDAZOLAM HCL 2 MG/2ML IJ SOLN
INTRAMUSCULAR | Status: AC
  Filled 2020-10-26: qty 4

## 2020-10-26 MED ORDER — THROMBIN 5000 UNITS EX SOLR
CUTANEOUS | Status: DC | PRN
  Administered 2020-10-26: 5000 [IU] via TOPICAL

## 2020-10-26 MED ORDER — SODIUM CHLORIDE 0.9 % IV SOLN: 250.0000 mL | INTRAVENOUS | Status: DC

## 2020-10-26 MED ORDER — SUGAMMADEX SODIUM 200 MG/2ML IV SOLN
INTRAVENOUS | Status: DC | PRN
  Administered 2020-10-26: 225 mg via INTRAVENOUS

## 2020-10-26 MED ORDER — ENOXAPARIN SODIUM 40 MG/0.4ML ~~LOC~~ SOLN
40.0000 mg | SUBCUTANEOUS | Status: DC
  Administered 2020-10-27 – 2020-10-28 (×2): 40 mg via SUBCUTANEOUS
  Filled 2020-10-26 (×2): qty 0.4

## 2020-10-26 MED ORDER — FENTANYL CITRATE (PF) 250 MCG/5ML IJ SOLN
INTRAMUSCULAR | Status: AC
  Filled 2020-10-26: qty 5

## 2020-10-26 MED ORDER — LIDOCAINE 2% (20 MG/ML) 5 ML SYRINGE
INTRAMUSCULAR | Status: AC
  Filled 2020-10-26: qty 5

## 2020-10-26 MED ORDER — ROCURONIUM BROMIDE 10 MG/ML (PF) SYRINGE
PREFILLED_SYRINGE | INTRAVENOUS | Status: AC
  Filled 2020-10-26: qty 10

## 2020-10-26 MED ORDER — GABAPENTIN 300 MG PO CAPS
300.0000 mg | ORAL_CAPSULE | Freq: Three times a day (TID) | ORAL | Status: DC
  Administered 2020-10-26 – 2020-10-28 (×6): 300 mg via ORAL
  Filled 2020-10-26 (×6): qty 1

## 2020-10-26 MED ORDER — OXYCODONE HCL 5 MG PO TABS: 5.0000 mg | ORAL_TABLET | ORAL | Status: DC | PRN

## 2020-10-26 MED ORDER — SCOPOLAMINE 1 MG/3DAYS TD PT72
1.0000 | MEDICATED_PATCH | TRANSDERMAL | Status: DC
  Administered 2020-10-26: 1.5 mg via TRANSDERMAL
  Filled 2020-10-26: qty 1

## 2020-10-26 MED ORDER — SODIUM CHLORIDE 0.9% FLUSH
3.0000 mL | Freq: Two times a day (BID) | INTRAVENOUS | Status: DC
  Administered 2020-10-26 – 2020-10-27 (×3): 3 mL via INTRAVENOUS

## 2020-10-26 MED ORDER — ALBUMIN HUMAN 5 % IV SOLN: INTRAVENOUS | Status: DC | PRN

## 2020-10-26 MED ORDER — SUFENTANIL CITRATE 50 MCG/ML IV SOLN
0.2500 ug/kg/h | INTRAVENOUS | Status: DC
  Filled 2020-10-26: qty 1

## 2020-10-26 MED ORDER — FENTANYL CITRATE (PF) 250 MCG/5ML IJ SOLN
INTRAMUSCULAR | Status: DC | PRN
  Administered 2020-10-26: 50 ug via INTRAVENOUS
  Administered 2020-10-26: 100 ug via INTRAVENOUS

## 2020-10-26 MED ORDER — HYDROMORPHONE HCL 1 MG/ML IJ SOLN
0.5000 mg | INTRAMUSCULAR | Status: DC | PRN
  Administered 2020-10-26: 1 mg via INTRAVENOUS

## 2020-10-26 MED ORDER — METHOCARBAMOL 500 MG PO TABS: 500.0000 mg | ORAL_TABLET | Freq: Three times a day (TID) | ORAL | 0 refills | Status: AC | PRN

## 2020-10-26 MED ORDER — BUPIVACAINE HCL (PF) 0.25 % IJ SOLN
INTRAMUSCULAR | Status: DC | PRN
  Administered 2020-10-26: 25 mL

## 2020-10-26 MED ORDER — BUPIVACAINE LIPOSOME 1.3 % IJ SUSP
INTRAMUSCULAR | Status: DC | PRN
  Administered 2020-10-26: 10 mL

## 2020-10-26 MED ORDER — SUFENTANIL CITRATE 50 MCG/ML IV SOLN
0.2500 ug/kg/h | INTRAVENOUS | Status: AC
  Administered 2020-10-26: .2 ug/kg/h via INTRAVENOUS
  Administered 2020-10-26: .15 ug/kg/h via INTRAVENOUS
  Filled 2020-10-26: qty 1

## 2020-10-26 MED ORDER — 0.9 % SODIUM CHLORIDE (POUR BTL) OPTIME
TOPICAL | Status: DC | PRN
  Administered 2020-10-26 (×2): 1000 mL

## 2020-10-26 MED ORDER — ACETAMINOPHEN 650 MG RE SUPP: 650.0000 mg | RECTAL | Status: DC | PRN

## 2020-10-26 MED ORDER — CEFAZOLIN SODIUM-DEXTROSE 1-4 GM/50ML-% IV SOLN
1.0000 g | Freq: Three times a day (TID) | INTRAVENOUS | Status: AC
  Administered 2020-10-26 (×2): 1 g via INTRAVENOUS
  Filled 2020-10-26 (×2): qty 50

## 2020-10-26 MED ORDER — SODIUM CHLORIDE 0.9 % IV SOLN
INTRAVENOUS | Status: DC | PRN
  Administered 2020-10-26: 500 mL

## 2020-10-26 MED ORDER — BUPIVACAINE-EPINEPHRINE (PF) 0.25% -1:200000 IJ SOLN
INTRAMUSCULAR | Status: AC
  Filled 2020-10-26: qty 30

## 2020-10-26 MED ORDER — THROMBIN (RECOMBINANT) 5000 UNITS EX SOLR
CUTANEOUS | Status: AC
  Filled 2020-10-26: qty 5000

## 2020-10-26 SURGICAL SUPPLY — 91 items
ANCHOR LUMBAR 25 MIS (Anchor) ×6 IMPLANT
APPLIER CLIP 11 MED OPEN (CLIP) ×4
BLADE SURG 10 STRL SS (BLADE) ×2 IMPLANT
BUR EGG ELITE 4.0 (BURR) ×2 IMPLANT
CABLE BIPOLOR RESECTION CORD (MISCELLANEOUS) ×2 IMPLANT
CATH FOLEY 2WAY SLVR  5CC 16FR (CATHETERS) ×1
CATH FOLEY 2WAY SLVR 5CC 16FR (CATHETERS) ×1 IMPLANT
CLIP APPLIE 11 MED OPEN (CLIP) ×2 IMPLANT
CLIP LIGATING EXTRA MED SLVR (CLIP) ×2 IMPLANT
CLIP LIGATING EXTRA SM BLUE (MISCELLANEOUS) ×2 IMPLANT
CLIP NEUROVISION LG (CLIP) ×6 IMPLANT
CLSR STERI-STRIP ANTIMIC 1/2X4 (GAUZE/BANDAGES/DRESSINGS) ×8 IMPLANT
COVER SURGICAL LIGHT HANDLE (MISCELLANEOUS) ×12 IMPLANT
DERMABOND ADVANCED (GAUZE/BANDAGES/DRESSINGS) ×1
DERMABOND ADVANCED .7 DNX12 (GAUZE/BANDAGES/DRESSINGS) ×1 IMPLANT
DRAPE C-ARM 42X72 X-RAY (DRAPES) ×6 IMPLANT
DRAPE C-ARMOR (DRAPES) ×4 IMPLANT
DRAPE INCISE IOBAN 66X45 STRL (DRAPES) ×6 IMPLANT
DRAPE POUCH INSTRU U-SHP 10X18 (DRAPES) ×2 IMPLANT
DRAPE SURG 17X23 STRL (DRAPES) ×2 IMPLANT
DRAPE U-SHAPE 47X51 STRL (DRAPES) ×2 IMPLANT
DRAPE UNIVERSAL PACK (DRAPES) ×4 IMPLANT
DRSG OPSITE 4X5.5 SM (GAUZE/BANDAGES/DRESSINGS) ×4 IMPLANT
DRSG OPSITE POSTOP 4X6 (GAUZE/BANDAGES/DRESSINGS) ×4 IMPLANT
DRSG OPSITE POSTOP 4X8 (GAUZE/BANDAGES/DRESSINGS) ×2 IMPLANT
DURAPREP 26ML APPLICATOR (WOUND CARE) ×2 IMPLANT
ELECT BLADE 4.0 EZ CLEAN MEGAD (MISCELLANEOUS) ×4
ELECT CAUTERY BLADE 6.4 (BLADE) ×2 IMPLANT
ELECT PENCIL ROCKER SW 15FT (MISCELLANEOUS) ×2 IMPLANT
ELECT REM PT RETURN 9FT ADLT (ELECTROSURGICAL) ×2
ELECTRODE BLDE 4.0 EZ CLN MEGD (MISCELLANEOUS) ×2 IMPLANT
ELECTRODE REM PT RTRN 9FT ADLT (ELECTROSURGICAL) ×1 IMPLANT
GAUZE 4X4 16PLY RFD (DISPOSABLE) ×2 IMPLANT
GLOVE BIO SURGEON STRL SZ 6.5 (GLOVE) ×2 IMPLANT
GLOVE BIOGEL PI IND STRL 8.5 (GLOVE) ×2 IMPLANT
GLOVE BIOGEL PI INDICATOR 8.5 (GLOVE) ×2
GLOVE SS BIOGEL STRL SZ 7.5 (GLOVE) ×1 IMPLANT
GLOVE SS BIOGEL STRL SZ 8.5 (GLOVE) ×6 IMPLANT
GLOVE SUPERSENSE BIOGEL SZ 7.5 (GLOVE) ×1
GLOVE SUPERSENSE BIOGEL SZ 8.5 (GLOVE) ×6
GLOVE SURG UNDER POLY LF SZ6.5 (GLOVE) ×2 IMPLANT
GOWN STRL REUS W/ TWL LRG LVL3 (GOWN DISPOSABLE) ×2 IMPLANT
GOWN STRL REUS W/TWL 2XL LVL3 (GOWN DISPOSABLE) ×6 IMPLANT
GOWN STRL REUS W/TWL LRG LVL3 (GOWN DISPOSABLE) ×2
GUIDEWIRE NITINOL BEVEL TIP (WIRE) ×8 IMPLANT
KIT BASIN OR (CUSTOM PROCEDURE TRAY) ×2 IMPLANT
KIT HARVESTER BONE 10 DISP (KITS) ×2 IMPLANT
KIT TURNOVER KIT B (KITS) ×2 IMPLANT
MODULE EMG NEEDLE SSEP NVM5 (NEEDLE) ×2 IMPLANT
MODULE NVM5 NEXT GEN EMG (NEEDLE) ×2 IMPLANT
NEEDLE SPNL 18GX3.5 QUINCKE PK (NEEDLE) ×2 IMPLANT
NS IRRIG 1000ML POUR BTL (IV SOLUTION) ×2 IMPLANT
PACK LAMINECTOMY ORTHO (CUSTOM PROCEDURE TRAY) ×2 IMPLANT
PACK UNIVERSAL I (CUSTOM PROCEDURE TRAY) ×2 IMPLANT
PAD ARMBOARD 7.5X6 YLW CONV (MISCELLANEOUS) ×8 IMPLANT
PROBE BALL TIP NVM5 SNG USE (BALLOONS) ×2 IMPLANT
PUTTY DBX 2.5CC (Putty) ×2 IMPLANT
PUTTY DBX 2.5CC DEPUY (Putty) ×1 IMPLANT
ROD RELINE MAS LORD 5.5X45MM (Rod) ×2 IMPLANT
ROD RELINE MAS LORD 5.5X50 (Rod) ×2 IMPLANT
SCREW LOCK RELINE 5.5 TULIP (Screw) ×8 IMPLANT
SCREW RELINE MAS POLY 6.5X40MM (Screw) ×2 IMPLANT
SCREW RELINE RED 6.5X45MM POLY (Screw) ×2 IMPLANT
SPACER HEDRON IA 29X39X15 8D (Spacer) ×2 IMPLANT
SPONGE INTESTINAL PEANUT (DISPOSABLE) ×6 IMPLANT
SPONGE LAP 18X18 RF (DISPOSABLE) ×6 IMPLANT
SPONGE LAP 4X18 RFD (DISPOSABLE) ×2 IMPLANT
SPONGE SURGIFOAM ABS GEL 100 (HEMOSTASIS) ×2 IMPLANT
STAPLER VISISTAT 35W (STAPLE) ×2 IMPLANT
SURGIFLO W/THROMBIN 8M KIT (HEMOSTASIS) ×6 IMPLANT
SUT BONE WAX W31G (SUTURE) ×2 IMPLANT
SUT MNCRL AB 3-0 PS2 27 (SUTURE) ×6 IMPLANT
SUT MNCRL+ AB 3-0 CT1 36 (SUTURE) ×1 IMPLANT
SUT MONOCRYL AB 3-0 CT1 36IN (SUTURE) ×1
SUT PDS AB 1 CT  36 (SUTURE) ×1
SUT PDS AB 1 CT 36 (SUTURE) ×1 IMPLANT
SUT PDS AB 1 CTX 36 (SUTURE) ×2 IMPLANT
SUT SILK 0 TIES 10X30 (SUTURE) ×2 IMPLANT
SUT SILK 2 0 TIES 10X30 (SUTURE) ×4 IMPLANT
SUT SILK 3 0 TIES 10X30 (SUTURE) ×4 IMPLANT
SUT VIC AB 0 CT1 18XCR BRD8 (SUTURE) ×1 IMPLANT
SUT VIC AB 0 CT1 8-18 (SUTURE) ×1
SUT VIC AB 1 CT1 18XCR BRD 8 (SUTURE) ×1 IMPLANT
SUT VIC AB 1 CT1 27 (SUTURE) ×2
SUT VIC AB 1 CT1 27XBRD ANBCTR (SUTURE) ×2 IMPLANT
SUT VIC AB 1 CT1 8-18 (SUTURE) ×1
SUT VIC AB 2-0 CT1 18 (SUTURE) ×10 IMPLANT
SYR BULB IRRIG 60ML STRL (SYRINGE) ×2 IMPLANT
TOWEL GREEN STERILE (TOWEL DISPOSABLE) ×4 IMPLANT
TOWEL GREEN STERILE FF (TOWEL DISPOSABLE) ×2 IMPLANT
WATER STERILE IRR 1000ML POUR (IV SOLUTION) ×2 IMPLANT

## 2020-10-26 NOTE — H&P (Signed)
Chief Complaint:  Severe LBP with radicular left leg pain  History: Joanna Sweeney a very pleasant 60 year old woman unfortunately has worked injury in 2019.  She has had persistent back buttock and neuropathic left leg pain.  She has had physical therapy, multiple epidural steroid injections which provided no significant permanent improvement.  She states that her back pain is causing 80% of her severe debilitating pain.  Imaging studies demonstrated degenerative disc disease at L5-S1.  She ultimately had an intradiscal injection and had significant improvement in her quality of life and overall function.  As result of the failure of conservative management, confirmation of pain generators with the discogram and the imaging changes we have elected to move forward with surgery.  Surgical plan is an anterior lumbar interbody fusion L5-S1 with supplemental posterior pedicle screw fixation.  Preoperative medical clearance was obtained.  Past Medical History:  Diagnosis Date  . Asthma   . Bipolar disorder ([place])   . Cervical radiculopathy   . Chronic ankle pain   . Degeneration of lumbar intervertebral disc   . Depression   . Hypertension   . Lumbar radiculopathy   . Lumbar spine pain   . Muscle atrophy   . Pain in right foot   . Pain in right knee     No Known Allergies  No current facility-administered medications on file prior to encounter.   Current Outpatient Medications on File Prior to Encounter  Medication Sig Dispense Refill  . diazepam (VALIUM) 5 MG tablet Take 5 mg by mouth every 6 (six) hours as needed for anxiety.    . Melatonin 10 MG TABS Take 10 mg by mouth at bedtime as needed (sleep).    . sertraline (ZOLOFT) 100 MG tablet Take 100 mg by mouth daily.    Marland Kitchen albuterol (ACCUNEB) 0.63 MG/3ML nebulizer solution Take 1 ampule by nebulization every 6 (six) hours as needed for wheezing. (Patient not taking: No sig reported)    . budesonide-formoterol (SYMBICORT) 80-4.5 MCG/ACT  inhaler Inhale 2 puffs into the lungs 2 (two) times daily.    Marland Kitchen doxepin (SINEQUAN) 10 MG capsule Take 10 mg by mouth.    . gabapentin (NEURONTIN) 300 MG capsule Take 300 mg by mouth 3 (three) times daily. (Patient not taking: Reported on 10/19/2020)    . lamoTRIgine (LAMICTAL) 100 MG tablet Take 100 mg by mouth daily. (Patient not taking: Reported on 10/19/2020)    . lidocaine (LIDODERM) 5 % Place 1 patch onto the skin daily. Remove & Discard patch within 12 hours or as directed by MD (Patient not taking: Reported on 10/19/2020)    . methocarbamol (ROBAXIN) 500 MG tablet Take 500 mg by mouth 4 (four) times daily.    . misoprostol (CYTOTEC) 200 MCG tablet Take 200 mcg by mouth 4 (four) times daily. (Patient not taking: Reported on 10/19/2020)    . promethazine (PHENERGAN) 25 MG tablet Take 25 mg by mouth every 6 (six) hours as needed for nausea or vomiting.    . Vitamin D, Ergocalciferol, (DRISDOL) 1.25 MG (50000 UNIT) CAPS capsule Take 50,000 Units by mouth every 7 (seven) days.      Physical Exam: Vitals:   10/26/20 0630 10/26/20 0635  BP: (!) 149/101 (!) 159/97  Pulse:    Resp:    Temp:    SpO2:     Body mass index is 33.23 kg/m.  Clinical exam: [name] is a pleasant individual, who appears younger than their stated age. She is alert and  orientated 3. No shortness of breath, chest pain. Abdomen is soft and non-tender, negative loss of bowel and bladder control, no rebound tenderness. Negative: skin lesions abrasions contusions    Peripheral pulses: 2+ dorsalis pedis/posterior tibialis pulses bilaterally. Compartment soft and nontender.    Gait pattern: Normal    Assistive devices: None    Neuro: 5/5 motor strength in the lower extremity bilaterally. Positive bilateral straight leg raise test with reproduction of radicular leg pain predominantly in the L5 and S1 dermatome. Intact sensation to light touch throughout the lower extremity. Positive numbness and dysesthesias into the L5 and  S1 dermatome bilaterally. Negative Hoffman test, negative Babinski test, no clonus. 1+ deep tendon reflexes at the knee and Achilles.    Musculoskeletal: Significant back pain with palpation and range of motion. Pain is predominantly in the midline lumbar spine and radiates into the paraspinal region.     Image: DG Chest 2 View  Result Date: 10/25/2020 CLINICAL DATA:  Preop.  Preop back surgery. EXAM: CHEST - 2 VIEW COMPARISON:  None. FINDINGS: Upper normal heart size.The cardiomediastinal contours are normal. The lungs are clear. Pulmonary vasculature is normal. No consolidation, pleural effusion, or pneumothorax. No acute osseous abnormalities are seen. IMPRESSION: Upper normal heart size. No acute chest findings. Electronically Signed   By: Keith Rake M.D.   On: 10/25/2020 11:55   X-rays of the lumbar spine demonstrate degenerative lumbar disc disease at L5-S1. No spondylolisthesis or scoliosis is noted. No fracture is seen.    Lumbar MRI: completed on 07/06/20 was reviewed with the patient. It was completed at [facility]; I have independently reviewed the images as well as the radiology report. Degenerative lumbar disc disease L5-S1 with a left-sided disc protrusion causing moderate displacement of the descending left S1 nerve root. Positive endplate signal changes noted. No fracture or abnormal marrow signal change. There is significant atrophy within the multifidus muscles L2-S1.    Assessment & Plan Diagnosis: At this point time we have had a discussion about her chronic pain. She is had this issue for more than 6 months and it is significantly impacting her quality-of-life. Based on the intradiscal injection I believe her primary issue is the degenerative disc disease L5-S1. While she does have a posterior lateral disc protrusion she is not complaining of significant S1 radicular pain, nor does she have any focal neurological deficits.  Treatment plan: At this point time the only  treatment option I have left offer would be an L5-S1 fusion. This would remove the painful disc and reduce her back pain. I have gone over the surgical procedure in great detail with the patient and her mother and all their questions were encouraged and addressed.   We have received preoperative medical clearance from the patient's primary care provider who is recommending the patient be seen by cardiologist which is scheduled for February 7. Patient is also scheduled to see Dr. Tawni Millers on February 7. Patient does have her LSO brace which was provided by work comp.  I have reviewed the patient's medication list. She is not on any blood thinners. She is not taking the aspirin or using any anti-inflammatory medications. I have advised her to avoid over-the-counter anti-inflammatory medications one week prior to surgery as well as 6 weeks out.  We have also discussed the post-operative recovery period to include: bathing/showering restrictions, wound healing, activity (and driving) restrictions, medications/pain mangement.  We have also discussed post-operative redflags to include: signs and symptoms of postoperative infection, DVT/PE.  Patient was  scheduled for an anterior L5/S1 fusion. Due to her body habitus and size I elected to move forward with an anterior L5-S1 fusion with supplemental posterior pedicle screw fixation in order to improve her overall fusion rate. I have gone over the surgical procedure with the patient and her son and all their questions were addressed. We have addressed the risks benefits and alternatives.    Risks and benefits of surgery were discussed with the patient. These include: Infection, bleeding, death, stroke, paralysis, ongoing or worse pain, need for additional surgery, nonunion, leak of spinal fluid, adjacent segment degeneration requiring additional fusion surgery, need for posterior decompression and/or fusion. Bleeding from major vessels, and blood clots (deep venous  thrombosis)requiring additional treatment, loss in bowel and bladder control, injury to bladder, ureters, and other major abdominal organs.    Risks and benefits of spinal fusion: Infection, bleeding, death, stroke, paralysis, ongoing or worse pain, need for additional surgery, nonunion, leak of spinal fluid, adjacent segment degeneration requiring additional fusion surgery, Injury to abdominal vessels that can require anterior surgery to stop bleeding. Malposition of the cage and/or pedicle screws that could require additional surgery. Loss of bowel and bladder control. Postoperative hematoma causing neurologic compression that could require urgent or emergent re-operation.

## 2020-10-26 NOTE — Transfer of Care (Signed)
Immediate Anesthesia Transfer of Care Note  Patient: Joanna Sweeney  Procedure(s) Performed: ANTERIOR LUMBAR FUSION L5-S1, LEFT ILIAC CREST BONE GRAFT, POSTERIOR SPINAL FUSION INTERBODY L5-S1 (N/A ) ABDOMINAL EXPOSURE (N/A )  Patient Location: PACU  Anesthesia Type:General and Regional  Level of Consciousness: alert , drowsy, patient cooperative and responds to stimulation  Airway & Oxygen Therapy: Patient Spontanous Breathing and Patient connected to face mask oxygen  Post-op Assessment: Report given to RN, Post -op Vital signs reviewed and stable and Patient moving all extremities X 4  Post vital signs: Reviewed and stable  Last Vitals:  Vitals Value Taken Time  BP 139/81 10/26/20 1344  Temp    Pulse 67 10/26/20 1347  Resp 19 10/26/20 1347  SpO2 100 % 10/26/20 1347  Vitals shown include unvalidated device data.  Last Pain:  Vitals:   10/26/20 0635  TempSrc:   PainSc: 0-No pain      Patients Stated Pain Goal: 3 (91/02/89 0228)  Complications: No complications documented.

## 2020-10-26 NOTE — Anesthesia Procedure Notes (Signed)
Procedure Name: Intubation Date/Time: 10/26/2020 7:54 AM Performed by: Hendricks Limes, CRNA Pre-anesthesia Checklist: Patient identified, Emergency Drugs available, Suction available and Patient being monitored Patient Re-evaluated:Patient Re-evaluated prior to induction Oxygen Delivery Method: Circle system utilized Preoxygenation: Pre-oxygenation with 100% oxygen Induction Type: IV induction Ventilation: Mask ventilation without difficulty Laryngoscope Size: Miller and 2 Grade View: Grade I Tube type: Oral Tube size: 7.0 mm Number of attempts: 1 Airway Equipment and Method: Stylet Placement Confirmation: ETT inserted through vocal cords under direct vision,  positive ETCO2 and breath sounds checked- equal and bilateral Secured at: 20 cm Tube secured with: Tape Dental Injury: Teeth and Oropharynx as per pre-operative assessment

## 2020-10-26 NOTE — Anesthesia Procedure Notes (Signed)
Arterial Line Insertion Start/End2/23/2022 7:15 AM, 10/26/2020 7:26 AM Performed by: Josephine Igo, CRNA, CRNA  Preanesthetic checklist: patient identified, IV checked, surgical consent, monitors and equipment checked, pre-op evaluation and timeout performed Lidocaine 1% used for infiltration Right, radial was placed Catheter size: 20 G Hand hygiene performed  and maximum sterile barriers used   Attempts: 1 Procedure performed without using ultrasound guided technique. Following insertion, dressing applied and Biopatch. Post procedure assessment: normal  Patient tolerated the procedure well with no immediate complications.

## 2020-10-26 NOTE — OR Nursing (Signed)
Per Dr Rolena Infante request, waiting room desk attendant called with update on start of posterior spine portion of procedure for pt's son.

## 2020-10-26 NOTE — Anesthesia Procedure Notes (Signed)
Anesthesia Regional Block: TAP block   Pre-Anesthetic Checklist: ,, timeout performed, Correct Patient, Correct Site, Correct Laterality, Correct Procedure, Correct Position, site marked, Risks and benefits discussed,  Surgical consent,  Pre-op evaluation,  At surgeon's request and post-op pain management  Laterality: Left  Prep: chloraprep       Needles:  Injection technique: Single-shot  Needle Type: Echogenic Stimulator Needle     Needle Length: 10cm  Needle Gauge: 21     Additional Needles:   Narrative:  Start time: 10/26/2020 7:11 AM End time: 10/26/2020 7:21 AM Injection made incrementally with aspirations every 5 mL.  Performed by: Personally

## 2020-10-26 NOTE — Discharge Instructions (Signed)
  Spinal Fusion, Adult, Care After This sheet gives you information about how to care for yourself after your procedure. Your doctor may also give you more specific instructions. If you have problems or questions, contact your doctor. Follow these instructions at home: Medicines Take over-the-counter and prescription medicines only as told by your doctor. These include any medicines for pain or blood-thinning medicines (anticoagulants). If you were prescribed an antibiotic medicine, take it as told by your doctor. Do not stop taking the antibiotic even if you start to feel better. Do not drive for 24 hours if you were given a medicine to help you relax (sedative) during your procedure. Do not drive or use heavy machinery while taking prescription pain medicine. If you have a brace: Wear the brace as told by your doctor. Take it off only as told by your doctor. Keep the brace clean. Managing pain, stiffness, and swelling If directed, put ice on the surgery area: If you have a removable brace, take it off as told by your doctor. Put ice in a plastic bag. Place a towel between your skin and the bag. Leave the ice on for 20 minutes, 2-3 times a day. Surgery cut care    Follow instructions from your doctor about how to take care of your cut from surgery (incision). Make sure you: Wash your hands with soap and water before you change your bandage (dressing). If you cannot use soap and water, use hand sanitizer. Change your bandage as told by your doctor. Leave stitches (sutures), skin glue, or skin tape (adhesive) strips in place. They may need to stay in place for 2 weeks or longer. If tape strips get loose and curl up, you may trim the loose edges. Do not remove tape strips completely unless your doctor says it is okay. Keep your cut from surgery clean and dry. Do not take baths, swim, or use a hot tub until your doctor says it is okay. Ask your doctor if you can take showers. You may only  be allowed to take sponge baths. Every day, check your cut from surgery and the area around it for: More redness, swelling, or pain. Fluid or blood. Warmth. Pus or a bad smell. If you have a drain tube, follow instructions from your doctor about caring for it. Do not take out the drain tube or any bandages unless your doctor says it is okay. Physical activity Rest and protect your back as much as possible. Follow instructions from your doctor about how to move. Use good posture to help your spine heal. Do not lift anything that is heavier than 8 lb (3.6 kg), or the limit that you are told, until your doctor says that it is safe. Do not twist or bend at the waist until your doctor says it is okay. It is best if you: Do not make pushing and pulling motions. Do not sit or lie down in the same position for a long time. Do not raise your hands or arms above your head. Return to your normal activities as told by your doctor. Ask your doctor what activities are safe for you. Rest and protect your back as much as you can. Do not start to exercise until your doctor says it is okay. Ask your doctor what kinds of exercise you can do to make your back stronger. Ok to shower in 5 days.  Do not take a bath or submerge the wound General instructions To prevent blood clots and lessen swelling   in your legs: Wear compression stockings as told. Walk one or more times every few hours as told by your doctor. Do not use any products that contain nicotine or tobacco, such as cigarettes and e-cigarettes. These can delay bone healing. If you need help quitting, ask your doctor. To prevent or treat constipation while you are taking prescription pain medicine, your doctor may suggest that you: Drink enough fluid to keep your pee (urine) pale yellow. Take over-the-counter or prescription medicines. Eat foods that are high in fiber. These include fresh fruits and vegetables, whole grains, and beans. Limit foods that  are high in fat and processed sugars, such as fried and sweet foods. Keep all follow-up visits as told by your doctor. This is important. Contact a doctor if: Your pain gets worse. Your medicine does not help your pain. Your legs or feet get painful or swollen. Your cut from surgery is more red, swollen, or painful. Your cut from surgery feels warm to the touch. You have: Fluid or blood coming from your cut from surgery. Pus or a bad smell coming from your cut from surgery. A fever. Weakness or loss of feeling (numbness) in your legs that is new or getting worse. Trouble controlling when you pee (urinate) or poop (have a bowel movement). You feel sick to your stomach (nauseous). You throw up (vomit). Get help right away if: Your pain is very bad. You have chest pain. You have trouble breathing. You start to have a cough. These symptoms may be an emergency. Do not wait to see if the symptoms will go away. Get medical help right away. Call your local emergency services (911 in the [place] Do not drive yourself to the hospital. Summary After the procedure, it is common to have pain in your back and pain by your surgery cut(s). Icing and pain medicines may help to control the pain. Follow directions from your doctor. Rest and protect your back as much as possible. Do not twist or bend at the waist. Get up and walk one or more times every few hours as told by your doctor. This information is not intended to replace advice given to you by your health care provider. Make sure you discuss any questions you have with your health care provider.  -signs and symptoms of a blood clot such as chest pain; shortness of breath; pain, swelling, or warmth in the leg -signs and symptoms of a stroke such as changes in vision; confusion; trouble speaking or understanding; severe headaches; sudden numbness or weakness of the face, arm or leg; trouble walking; dizziness; loss of coordination Side effects that  usually do not require medical attention (report to your doctor or health care professional if they continue or are bothersome): -hair loss -pain, redness, or irritation at site where injected This list may not describe all possible side effects. Call your doctor for medical advice about side effects. You may report side effects to FDA at 1-800-FDA-1088. Where should I keep my medicine? Keep out of the reach of children. Store at room temperature between 15 and 30 degrees C (59 and 86 degrees F). Do not freeze. If your injections have been specially prepared, you may need to store them in the refrigerator. Ask your pharmacist. Throw away any unused medicine after the expiration date. NOTE: This sheet is a summary. It may not cover all possible information. If you have questions about this medicine, talk to your doctor, pharmacist, or health care provider.       What is this medicine? ENOXAPARIN (ee nox a PA rin) is used after knee, hip, or abdominal surgeries to prevent blood clotting. It is also used to treat existing blood clots in the lungs or in the veins. This medicine may be used for other purposes; ask your health care provider or pharmacist if you have questions. COMMON BRAND NAME(S): Lovenox What should I tell my health care provider before I take this medicine? They need to know if you have any of these conditions: bleeding disorders, hemorrhage, or hemophilia infection of the heart or heart valves kidney or liver disease previous stroke prosthetic heart valve recent surgery or delivery of a baby ulcer in the stomach or intestine, diverticulitis, or other bowel disease an unusual or allergic reaction to enoxaparin, heparin, pork or pork products, other medicines, foods, dyes, or preservatives pregnant or trying to get pregnant breast-feeding How should I use this medicine? This medicine is for injection under the skin. It is usually given by a health-care  professional. You or a family member may be trained on how to give the injections. If you are to give yourself injections, make sure you understand how to use the syringe, measure the dose if necessary, and give the injection. To avoid bruising, do not rub the site where this medicine has been injected. Do not take your medicine more often than directed. Do not stop taking except on the advice of your doctor or health care professional. Make sure you receive a puncture-resistant container to dispose of the needles and syringes once you have finished with them. Do not reuse these items. Return the container to your doctor or health care professional for proper disposal. Talk to your pediatrician regarding the use of this medicine in children. Special care may be needed. Overdosage: If you think you have taken too much of this medicine contact a poison control center or emergency room at once. NOTE: This medicine is only for you. Do not share this medicine with others. What if I miss a dose? If you miss a dose, take it as soon as you can. If it is almost time for your next dose, take only that dose. Do not take double or extra doses. What may interact with this medicine? aspirin and aspirin-like medicines certain medicines that treat or prevent blood clots dipyridamole NSAIDs, medicines for pain and inflammation, like ibuprofen or naproxen This list may not describe all possible interactions. Give your health care provider a list of all the medicines, herbs, non-prescription drugs, or dietary supplements you use. Also tell them if you smoke, drink alcohol, or use illegal drugs. Some items may interact with your medicine. What should I watch for while using this medicine? Visit your healthcare professional for regular checks on your progress. You may need blood work done while you are taking this medicine. Your condition will be monitored carefully while you are receiving this medicine. It is important  not to miss any appointments. If you are going to need surgery or other procedure, tell your healthcare professional that you are using this medicine. Using this medicine for a long time may weaken your bones and increase the risk of bone fractures. Avoid sports and activities that might cause injury while you are using this medicine. Severe falls or injuries can cause unseen bleeding. Be careful when using sharp tools or knives. Consider using an electric razor. Take special care brushing or flossing your teeth. Report any injuries, bruising, or red spots on the skin to your healthcare professional.   Wear a medical ID bracelet or chain. Carry a card that describes your disease and details of your medicine and dosage times. What side effects may I notice from receiving this medicine? Side effects that you should report to your doctor or health care professional as soon as possible: allergic reactions like skin rash, itching or hives, swelling of the face, lips, or tongue bone pain signs and symptoms of bleeding such as bloody or black, tarry stools; red or dark-brown urine; spitting up blood or brown material that looks like coffee grounds; red spots on the skin; unusual bruising or bleeding from the eye, gums, or nose signs and symptoms of a blood clot such as chest pain; shortness of breath; pain, swelling, or warmth in the leg signs and symptoms of a stroke such as changes in vision; confusion; trouble speaking or understanding; severe headaches; sudden numbness or weakness of the face, arm or leg; trouble walking; dizziness; loss of coordination Side effects that usually do not require medical attention (report to your doctor or health care professional if they continue or are bothersome): hair loss pain, redness, or irritation at site where injected This list may not describe all possible side effects. Call your doctor for medical advice about side effects. You may report side effects to FDA at  1-800-FDA-1088. Where should I keep my medicine? Keep out of the reach of children. Store at room temperature between 15 and 30 degrees C (59 and 86 degrees F). Do not freeze. If your injections have been specially prepared, you may need to store them in the refrigerator. Ask your pharmacist. Throw away any unused medicine after the expiration date. NOTE: This sheet is a summary. It may not cover all possible information. If you have questions about this medicine, talk to your doctor, pharmacist, or health care provider. 

## 2020-10-26 NOTE — Op Note (Signed)
    OPERATIVE REPORT  DATE OF SURGERY: 10/26/2020  PATIENT: Joanna Sweeney, 60 y.o. female MRN: [id number]  DOB: [birth year]  PRE-OPERATIVE DIAGNOSIS: Degenerative disc disease  POST-OPERATIVE DIAGNOSIS:  Same  PROCEDURE: Anterior exposure for L5-S1 disc fusion  SURGEON:  Curt Jews, M.D.  Co-surgeon for the exposure Dr. Rolena Infante  PHYSICIAN ASSISTANT: Dr.Cain  The assistant was needed for exposure and to expedite the case  ANESTHESIA: General  EBL: per anesthesia record  Total I/O In: 1850 [I.V.:1500; IV Piggyback:350] Out: 38 [Urine:610; Blood:200]  BLOOD ADMINISTERED: none  DRAINS: none  SPECIMEN: none  COUNTS CORRECT:  YES  PATIENT DISPOSITION:  PACU - hemodynamically stable  PROCEDURE DETAILS: Patient was taken operating placed supine position where the area of the abdomen was prepped and draped in usual sterile fashion.  Dr. Rolena Infante harvested bone from the left hip and this will be dictated as a separate note.  Following this patient had a incision over the level that was predetermined by x-ray over the L5-S1 disc.  This was from the midline to the left.  There is carried out through the subcutaneous tissue to the level of the anterior rectus sheath.  The anterior rectus sheath was opened in line with the skin incision.  The rectus muscle was mobilized medially.  The retroperitoneal space was entered bluntly.  The posterior sheath was opened laterally.  Dissection was continued to mobilize the intraperitoneal contents to the right.  The left ureter was identified and mobilized to the right.  Dissection was carried down to the L5-S1 disc.  The eyes iliac vessels were identified and mobilized.  The middle sacral vessels were clipped and divided.  Blunt dissection was continued to give adequate Sparger to the right and left of the L5-S1 disc.  The NuVasive retractor was brought onto the field and the blades were positioned to the right and left of the L5-S1 disc.  Retractors  were also placed to the inferior and superior.  A spinal needle was placed in the L5-S1 disc and C-arm was brought back onto the field to confirm that this was the appropriate level.  The remainder of the procedure will be dictated as a separate note by Dr. Lynnea Ferrier, M.D., [facility] 10/26/2020 1:54 PM  Note: Portions of this report may have been transcribed using voice recognition software.  Every effort has been made to ensure accuracy; however, inadvertent computerized transcription errors may still be present.

## 2020-10-26 NOTE — Brief Op Note (Signed)
10/26/2020  1:15 PM  PATIENT:  Joanna Sweeney  60 y.o. female  PRE-OPERATIVE DIAGNOSIS:  Degenerative disc disease with herniated disc L5-S1  POST-OPERATIVE DIAGNOSIS:  Degenerative disc disease with herniated disc L5-S1  PROCEDURE:  Procedure(s) with comments: ANTERIOR LUMBAR FUSION L5-S1, LEFT ILIAC CREST BONE GRAFT, POSTERIOR SPINAL FUSION INTERBODY L5-S1 (N/A) - 5 hrs tap block with exparel Dr. Carlis Abbott to do approach ABDOMINAL EXPOSURE (N/A)  SURGEON:  Surgeon(s) and Role: Panel 1:    Melina Schools, MD - Primary Panel 2:    * Early, Arvilla Meres, MD - Primary    * Waynetta Sandy, MD - Assisting  PHYSICIAN ASSISTANT:  Cleta Alberts, PA  ASSISTANTS: Estill Bamberg Ward, PA   ANESTHESIA:   general  EBL:  200 mL   BLOOD ADMINISTERED:none  DRAINS: none   LOCAL MEDICATIONS USED:  MARCAINE     SPECIMEN:  No Specimen  DISPOSITION OF SPECIMEN:  N/A  COUNTS:  YES  TOURNIQUET:  * No tourniquets in log *  DICTATION: .Dragon Dictation  PLAN OF CARE: Admit to inpatient   PATIENT DISPOSITION:  PACU - hemodynamically stable.

## 2020-10-26 NOTE — Anesthesia Postprocedure Evaluation (Signed)
Anesthesia Post Note  Patient: Joanna Sweeney  Procedure(s) Performed: ANTERIOR LUMBAR FUSION L5-S1, LEFT ILIAC CREST BONE GRAFT, POSTERIOR SPINAL FUSION INTERBODY L5-S1 (N/A ) ABDOMINAL EXPOSURE (N/A )     Patient location during evaluation: PACU Anesthesia Type: Regional and MAC Level of consciousness: awake and alert Pain management: pain level controlled Vital Signs Assessment: post-procedure vital signs reviewed and stable Respiratory status: spontaneous breathing and respiratory function stable Cardiovascular status: stable Postop Assessment: no apparent nausea or vomiting Anesthetic complications: no   No complications documented.  Last Vitals:  Vitals:   10/26/20 1458 10/26/20 1523  BP: (!) 155/87 (!) 154/95  Pulse: (!) 50 (!) 54  Resp: 13 18  Temp: 36.6 C (!) 36.4 C  SpO2: 100% 98%    Last Pain:  Vitals:   10/26/20 1523  TempSrc: Oral  PainSc:                  [place]

## 2020-10-26 NOTE — Op Note (Signed)
Operative report  Preoperative diagnosis: Degenerative lumbar disc disease with discogenic low back pain and radicular left leg pain after left L5-S1.  Postoperative diagnosis: Same  Operative procedure: Anterior lumbar interbody fusion L5-S1.  Anterior iliac crest bone graft harvest.  Posterior pedicle screw fixation and fusion L5-S1.  First Assistant: Cleta Alberts, PA  Vascular surgeon for approach.  Dr's Curt Jews, and Servando Snare  EBL: 200 cc.  Complications: None  Implants:  Globus hedron anterior interbody cage:15 x 29 x 39.  8 degree lordosis. Affixed with 25 mm anchors x3.    NuVasive MIS pedicle screws.  6.5 x 45 mm length at L5 and left S1.  6.5 x 40 mm length right S1.  Neuro monitoring: No adverse intraoperative neuro monitoring events.  All 4 pedicle screws were directly stimulated there was no adverse activity at greater than 40 million  Graft: Iliac crest bone graft harvest from separate incision.  Supplemented with DBX.  Indications: Joanna Sweeney is a very pleasant 60 year old man with significant back and neuropathic left leg pain.  Despite appropriate conservative management and overall quality of life is continued to deteriorate.  As result we elected to move forward with surgery.  All appropriate risks, benefits, alternatives were discussed with the patient.  Operative report: Patient was brought the operating room placed upon the operating table.  After successful induction of general anesthesia and endotracheal intubation: Teds, SCDs, and a Foley were inserted.  The abdomen was then prepped and draped in a standard fashion.  Timeout was taken to confirm patient, procedure and all other important data.  The iliac crest was palpated and a small incision site was marked out and infiltrated with quarter percent Marcaine.  A small incision was made and I sharply dissected down to the iliac crest.  I then used a small rondure to remove the outer cortex and then I placed the  harvesting device on the iliac crest.  I then made multiple passes collecting iliac crest bone graft.  After I had approximately 6 cc of graft I irrigated the wound copiously with normal saline. Hemostasis was obtained using bipolar electrocautery as well as though floseal.  Once I confirmed that an adequate amount of bone graft I then closed the deep fascia over the iliac crest with interrupted #1 Vicryl sutures then was closed in a layered fashion with 2-0 Vicryl sutures, and 3-0 Monocryl.  With the iliac crest bone graft harvest complete Dr. Donnetta Hutching and Dr. Donzetta Matters then scrubbed into the case to perform a standard anterior retroperitoneal approach to the lumbar spine.  Please refer to Dr. Luther Parody note for details on the approach.  Once the retractors were placed in the L5-S1 disc was exposed I then scrubbed back into the surgery.  We marked the L5-S1 disc space with a needle and a lateral fluoroscopy view was taken to confirm that we are at the appropriate level.  Once we confirmed that we were at the appropriate level the needle was removed and an annulotomy was performed with a 10 blade scalpel.  Using a Cobb elevator I separated the cartilaginous disc from the endplate.  I then remove the disc space and bulk with Leksell rongeurs.  Using multitude of curettes I removed all of the disc and cartilaginous endplates I have bleeding subchondral bone I then worked posteriorly towards the posterior aspect of the vertebral body.  A distraction elevator was placed into the disc space in order to improve visualization posteriorly.  I then used a fine curved  curette to release the posterior annulus.  I confirmed with fluoroscopy that the curette was behind the posterior aspect of the S1 vertebral body and I released the annulus along the length of the posterior S1 vertebral body.  I repeated this at L5.  I then was able to take a nerve hook in the left lateral corner and gently mobilized the annulus in the disc fragment  that was seen on the preoperative MRI.  All of this was removed with pituitary rongeurs.  A 2 mm Kerrison rongeur was then used to remove the posterior osteophyte from the vertebral body of S1.  At this point I had an adequate discectomy.  Under live fluoroscopy I confirmed that parallel endplate distraction.  I then used the trial intervertebral spacers to confirm satisfactory overall restoration of vertebral height and indirect foraminal decompression.  At this point I was pleased with the 15 x 29 x 39 with 8 degree lordosis spacer.  This provided the best overall restoration of disc height and indirect foraminal decompression.  I then used the rasp to prepare the endplate and ensure had bleeding subchondral bone.  I then checked again with a nerve hook along the posterior aspect of the disc space to confirm satisfactory release and removal of all bone spurs to adequately decompress the nerve roots.  The implant was obtained and packed with the autograft bone.  I supplemented this with 2 cc DBX bone graft.  Once the graft was packed I then malleted into position.  I confirmed with fluoroscopy satisfactory overall positioning.  The locking anchors were then positioned appropriately 1 in the body of L5 into the body of S1.  All 3 locking anchors had excellent purchase.  The inserting device was removed and the locking caps were engaged according manufacture standards at this point I irrigated wound copiously normal saline and sequentially remove the retractors to confirm hemostasis.  Once all 4 retractors were removed I then closed the fascia of the rectus with a running #1 PDS suture.  The rest of the wound was closed in a layered fashion with interrupted #1 Vicryl suture, 2-0 Vicryl suture, and a 3-0 Monocryl.  Steri-Strips and dry dressings were applied to both wounds.  An intraoperative AP x-ray was taken and read by the radiologist to ensure there was no retained surgical instruments in the wound.  At this  point the [facility] spine frame was secured over the patient and she was rotated into the prone position.  The back was then prepped and draped in a standard fashion.  A second timeout was then done confirming that we are moving forward with the posterior L5-S1 instrumented fusion.  Using fluoroscopy identified the lateral borders of the L5 and S1 pedicles.  I marked this area out with a pen and then infiltrated the area with quarter percent Marcaine with epinephrine 2 small incisions were made and the Jamshidi needle was advanced percutaneously to the lateral aspect of the L5 pedicle I confirmed satisfactory position with fluoroscopy and then advanced the Jamshidi needle into the L5 pedicle I confirmed trajectory and positioning as well as direct neural stimulation.  Once the Jamshidi needle was nearing the medial wall of the pedicle as seen on the AP view I switched to the lateral view to confirm that I was just beyond the posterior wall.  Once I confirmed the trajectory advanced into the vertebral body a guidepin was then placed to cannulate the pedicle.  I repeated this exact same procedure at S1  and on the contralateral side.  On the left side I placed 6.5 x 45 mm length screws at L5 and S1.  Both screws had excellent overall purchase.  On the right side I placed a 6.5 x 45 mm length screw at L5, and a 6.5 x 40 mm length screw at S1.  All 4 pedicle screws were placed over the guidepins.  I then directly stimulated each of the pedicle screws and there was no adverse activity at greater than 40 mA.  I then measured and obtained the appropriate size rod and passed a percutaneously from L5 down to S1.  Once I confirmed that the rod was properly positioned I then inserted the locking caps and secured the rod to the screw.  All 4 locking caps were then tightened/torqued according manufacture standards.  The insertion tabs were then broken off and removed.  Final intraoperative x-rays demonstrated satisfactory  position of the pedicle screw rod construct as well as the intervertebral cage.  There was no evidence of migration or subsidence.  At this point all 4 posterior wounds were irrigated and closed in a layered fashion with interrupted #1 Vicryl suture, 2-0 Vicryl suture, and a 3-0 Monocryl stitch.  Steri-Strips were applied and the patient was ultimately extubated transfer the PACU without incident.  The end of the case all needle sponge counts were correct.  There were no adverse intraoperative events.

## 2020-10-27 ENCOUNTER — Inpatient Hospital Stay (HOSPITAL_COMMUNITY): Payer: No Typology Code available for payment source

## 2020-10-27 ENCOUNTER — Encounter (HOSPITAL_COMMUNITY): Payer: Self-pay | Admitting: Orthopedic Surgery

## 2020-10-27 DIAGNOSIS — Z9889 Other specified postprocedural states: Secondary | ICD-10-CM

## 2020-10-27 MED ORDER — MAGNESIUM CITRATE PO SOLN
1.0000 | Freq: Once | ORAL | Status: AC
  Administered 2020-10-27: 1 via ORAL
  Filled 2020-10-27: qty 296

## 2020-10-27 MED FILL — Thrombin (Recombinant) For Soln 5000 Unit: CUTANEOUS | Qty: 5000 | Status: AC

## 2020-10-27 NOTE — Evaluation (Signed)
Physical Therapy Evaluation Patient Details Name: Joanna Sweeney MRN: [id number] DOB: [birth year] Today's Date: 10/27/2020   History of Present Illness  60 y.o. female presenting with Degenerative disc disease with herniated disc L5-S1.  Underwent ALIF on 10/26/2020.   Clinical Impression  Pt admitted with above diagnosis. At the time of PT eval, pt was able to demonstrate transfers and ambulation with gross min guard assist to min assist and RW for support. Pt was educated on precautions, brace application/wearing schedule (not present but verbally reviewed), appropriate activity progression, and car transfer. Pt currently with functional limitations due to the deficits listed below (see PT Problem List). Pt will benefit from skilled PT to increase their independence and safety with mobility to allow discharge to the venue listed below.      Follow Up Recommendations No PT follow up;Supervision - Intermittent    Equipment Recommendations  Rolling walker with 5" wheels;3in1 (PT)    Recommendations for Other Services       Precautions / Restrictions Precautions Precautions: Back Precaution Booklet Issued: Yes (comment) Precaution Comments: Reviewed handout and pt was cued for precautions during functional mobility. Required Braces or Orthoses: Spinal Brace (not present during session - family to bring) Restrictions Weight Bearing Restrictions: No Other Position/Activity Restrictions: L hip pain from bone grafting      Mobility  Bed Mobility Overal bed mobility: Needs Assistance Bed Mobility: Rolling;Sidelying to Sit;Sit to Sidelying Rolling: Min guard Sidelying to sit: Min guard Sit to sidelying: Min assist General bed mobility comments: Assist for LE elevation back up into bed at end of session. Increased time and effort due to pain throughout.    Transfers Overall transfer level: Needs assistance Equipment used: Rolling walker (2 wheeled) Transfers: Sit to/from Stand Sit to  Stand: Min guard         General transfer comment: VC's for hand placement on seated surface for safety. Pt not able to stand by pushing up from the bed and pulled up with BUE's on walker instead. Therapist stabilized RW for safety.  Ambulation/Gait Ambulation/Gait assistance: Supervision Gait Distance (Feet): 250 Feet Assistive device: Rolling walker (2 wheeled) Gait Pattern/deviations: Step-through pattern;Decreased stride length;Trunk flexed Gait velocity: Decreased Gait velocity interpretation: <1.8 ft/sec, indicate of risk for recurrent falls General Gait Details: Slow and guarded with several short standing rest breaks due to pain. Pt able to improve gait speed towards end of gait training as L hip loosened up.  Stairs Stairs: Yes Stairs assistance: Min guard Stair Management: One rail Right;Step to pattern;Forwards Number of Stairs: 10 General stair comments: VC's for sequencing and general safety. No assist required however close hands on guarding provided for optimal safety.  Wheelchair Mobility    Modified Rankin (Stroke Patients Only)       Balance Overall balance assessment: Needs assistance Sitting-balance support: No upper extremity supported;Feet supported Sitting balance-[name] Scale: Fair     Standing balance support: Bilateral upper extremity supported;During functional activity Standing balance-[name] Scale: Poor Standing balance comment: Reliant on UE support for dynamic standing activity due to pain.                             Pertinent Vitals/Pain Pain Assessment: Faces Pain Score: 8  Faces Pain Scale: Hurts whole lot Pain Location: L hip/pelvis Pain Descriptors / Indicators: Grimacing;Operative site guarding;Sore Pain Intervention(s): Limited activity within patient's tolerance;Monitored during session;Repositioned    Home Living Family/patient expects to be discharged to:: Private residence Living Arrangements:  Children;Parent Available Help at Discharge: Family;Available 24 hours/day Type of Home: House (Townhome) Home Access: Stairs to enter   [employer] of Steps: 3 Home Layout: Two level;Bed/bath upstairs;1/2 bath on main level Home Equipment: None      Prior Function Level of Independence: Independent               Hand Dominance   Dominant Hand: Right    Extremity/Trunk Assessment   Upper Extremity Assessment Upper Extremity Assessment: Defer to OT evaluation    Lower Extremity Assessment Lower Extremity Assessment: LLE deficits/detail LLE Deficits / Details: Decreased strength and acute pain consistent with pain from bone graft site LLE: Unable to fully assess due to pain    Cervical / Trunk Assessment Cervical / Trunk Assessment: Other exceptions Cervical / Trunk Exceptions: s/p surgery  Communication   Communication: No difficulties  Cognition Arousal/Alertness: Awake/alert Behavior During Therapy: WFL for tasks assessed/performed Overall Cognitive Status: Within Functional Limits for tasks assessed                                        General Comments      Exercises     Assessment/Plan    PT Assessment Patient needs continued PT services  PT Problem List Decreased strength;Decreased activity tolerance;Decreased mobility;Decreased balance;Decreased knowledge of use of DME;Decreased safety awareness;Decreased knowledge of precautions;Pain       PT Treatment Interventions DME instruction;Gait training;Stair training;Functional mobility training;Therapeutic activities;Therapeutic exercise;Neuromuscular re-education;Patient/family education    PT Goals (Current goals can be found in the Care Plan section)  Acute Rehab PT Goals Patient Stated Goal: Hurt less and move a little better PT Goal Formulation: With patient Time For Goal Achievement: 11/03/20 Potential to Achieve Goals: Good    Frequency Min 5X/week   Barriers  to discharge        Co-evaluation               AM-PAC PT "6 Clicks" Mobility  Outcome Measure Help needed turning from your back to your side while in a flat bed without using bedrails?: None Help needed moving from lying on your back to sitting on the side of a flat bed without using bedrails?: A Little Help needed moving to and from a bed to a chair (including a wheelchair)?: A Little Help needed standing up from a chair using your arms (e.g., wheelchair or bedside chair)?: A Little Help needed to walk in hospital room?: A Little Help needed climbing 3-5 steps with a railing? : A Little 6 Click Score: 19    End of Session Equipment Utilized During Treatment: Gait belt Activity Tolerance: Patient tolerated treatment well Patient left: in bed;with call bell/phone within reach Nurse Communication: Mobility status PT Visit Diagnosis: Unsteadiness on feet (R26.81);Pain Pain - Right/Left: Left Pain - part of body: Hip (and low back)    Time: 4174-0814 PT Time Calculation (min) (ACUTE ONLY): 26 min   Charges:   PT Evaluation $PT Eval Low Complexity: 1 Low PT Treatments $Gait Training: 8-22 mins        Rolinda Roan, PT, DPT Acute Rehabilitation Services Pager: [phone] Office: [phone]   Thelma Comp 10/27/2020, 11:10 AM

## 2020-10-27 NOTE — Progress Notes (Signed)
VASCULAR LAB    Bilateral lower extremity venous duplex has been performed.  See CV proc for preliminary results.   Davide Risdon, RVT 10/27/2020, 11:11 AM

## 2020-10-27 NOTE — Progress Notes (Signed)
Patient ID: Joanna Sweeney, female   DOB: [birth year], 60 y.o.   MRN: [id number]  Progress Note    10/27/2020 6:24 AM 1 Day Post-Op  Subjective: Comfortable this morning.  Reports incisional soreness in her lower abdomen.  No nausea or vomiting   Vitals:   10/26/20 2309 10/27/20 0408  BP:  103/64  Pulse: 80 76  Resp:  20  Temp:  99.1 F (37.3 C)  SpO2: 92% 94%   Physical Exam: Abdomen benign.  Dressing intact.  CBC    Component Value Date/Time   WBC 12.6 (H) 10/26/2020 1604   RBC 3.66 (L) 10/26/2020 1604   HGB 11.4 (L) 10/26/2020 1604   HCT 34.6 (L) 10/26/2020 1604   PLT 260 10/26/2020 1604   MCV 94.5 10/26/2020 1604   MCH 31.1 10/26/2020 1604   MCHC 32.9 10/26/2020 1604   RDW 12.5 10/26/2020 1604    BMET    Component Value Date/Time   NA 141 10/24/2020 1351   K 3.6 10/24/2020 1351   CL 104 10/24/2020 1351   CO2 29 10/24/2020 1351   GLUCOSE 105 (H) 10/24/2020 1351   BUN 6 10/24/2020 1351   CREATININE 0.99 10/26/2020 1604   CALCIUM 9.1 10/24/2020 1351   GFRNONAA >60 10/26/2020 1604    INR    Component Value Date/Time   INR 1.0 10/24/2020 1351     Intake/Output Summary (Last 24 hours) at 10/27/2020 4481 Last data filed at 10/26/2020 1415 Gross per 24 hour  Intake 1850 ml  Output 1060 ml  Net 790 ml     Assessment/Plan:  60 y.o. female stable postop day 1.  Mobilization plan per Dr. Rolena Infante.  Will not follow actively.  Please call if we can assist     Rosetta Posner, MD [facility] [phone] 10/27/2020 6:24 AM

## 2020-10-27 NOTE — Evaluation (Signed)
Occupational Therapy Evaluation Patient Details Name: Joanna Sweeney MRN: [id number] DOB: May 14, 1961 Today's Date: 10/27/2020    History of Present Illness 60 y.o. female presenting with Degenerative disc disease with herniated disc L5-S1.  Underwent ANTERIOR LUMBAR FUSION L5-S1, LEFT ILIAC CREST BONE GRAFT, POSTERIOR SPINAL FUSION INTERBODY L5-S1.   Clinical Impression   Patient was admitted for the diagnosis above.  She plans on staying an additional night for due to pain, primarily at the L hip and her incision.  OT session limited due to increased discomfort post PT session.  OT will plan to see her the next date to review back precautions, and complete additional ADL training to ensure a safe transition home.  No post acute OT is anticipated.      Follow Up Recommendations  No OT follow up    Equipment Recommendations  3 in 1 bedside commode;Tub/shower bench    Recommendations for Other Services       Precautions / Restrictions Precautions Precautions: Back Precaution Booklet Issued: Yes (comment) Precaution Comments: PT issued booklet Required Braces or Orthoses: Spinal Brace Restrictions Weight Bearing Restrictions: No Other Position/Activity Restrictions: L hip pain from bone grafting      Mobility Bed Mobility Overal bed mobility: Needs Assistance Bed Mobility: Rolling;Supine to Sit;Sit to Supine Rolling: Min guard   Supine to sit: Min assist Sit to supine: Min assist   General bed mobility comments: limited by pain to L hip.    Transfers                      Balance Overall balance assessment: Needs assistance Sitting-balance support: No upper extremity supported;Feet supported Sitting balance-[name] Scale: Fair                                     ADL either performed or assessed with clinical judgement   ADL Overall ADL's : Needs assistance/impaired Eating/Feeding: Independent   Grooming: Wash/dry hands;Wash/dry face;Set  up;Sitting               Lower Body Dressing: Minimal assistance;Sitting/lateral leans;Adhering to back precautions                       Vision Patient Visual Report: No change from baseline       Perception     Praxis      Pertinent Vitals/Pain Pain Assessment: 0-10 Pain Score: 8  Pain Descriptors / Indicators: Grimacing;Guarding;Tender Pain Intervention(s): Monitored during session     Hand Dominance Right   Extremity/Trunk Assessment Upper Extremity Assessment Upper Extremity Assessment: Overall WFL for tasks assessed   Lower Extremity Assessment Lower Extremity Assessment: Defer to PT evaluation   Cervical / Trunk Assessment Cervical / Trunk Assessment: Normal   Communication Communication Communication: No difficulties   Cognition Arousal/Alertness: Awake/alert Behavior During Therapy: WFL for tasks assessed/performed Overall Cognitive Status: Within Functional Limits for tasks assessed                                                      Home Living Family/patient expects to be discharged to:: Private residence Living Arrangements: Children;Parent Available Help at Discharge: Family;Available 24 hours/day Type of Home: House (Townhome)       Home Layout: Two  level;Bed/bath upstairs;1/2 bath on main level Alternate Level Stairs-Number of Steps: 12   Bathroom Shower/Tub: Teacher, early years/pre: Standard     Home Equipment: None          Prior Functioning/Environment Level of Independence: Independent                 OT Problem List: Impaired balance (sitting and/or standing);Pain      OT Treatment/Interventions: Self-care/ADL training;DME and/or AE instruction;Balance training;Therapeutic activities    OT Goals(Current goals can be found in the care plan section) Acute Rehab OT Goals Patient Stated Goal: Hurt less and move a little better OT Goal Formulation: With patient Time For Goal  Achievement: 11/10/20 ADL Goals Pt Will Perform Grooming: with modified independence;standing Pt Will Perform Lower Body Bathing: with modified independence;sit to/from stand Pt Will Perform Lower Body Dressing: with modified independence;sit to/from stand Pt Will Transfer to Toilet: with modified independence;ambulating;regular height toilet Pt Will Perform Toileting - Clothing Manipulation and hygiene: Independently;sit to/from stand  OT Frequency: Min 2X/week   Barriers to D/C:    none noted       Co-evaluation              AM-PAC OT "6 Clicks" Daily Activity     Outcome Measure Help from another person eating meals?: None Help from another person taking care of personal grooming?: None Help from another person toileting, which includes using toliet, bedpan, or urinal?: A Little Help from another person bathing (including washing, rinsing, drying)?: A Little Help from another person to put on and taking off regular upper body clothing?: A Little Help from another person to put on and taking off regular lower body clothing?: A Little 6 Click Score: 20   End of Session Nurse Communication: Mobility status  Activity Tolerance: Patient limited by pain Patient left: in bed;with call bell/phone within reach  OT Visit Diagnosis: Unsteadiness on feet (R26.81);Pain Pain - Right/Left: Left Pain - part of body: Hip                Time: 4818-5909 OT Time Calculation (min): 22 min Charges:  OT General Charges $OT Visit: 1 Visit OT Evaluation $OT Eval Moderate Complexity: 1 Mod  10/27/2020  Rich, OTR/L  Acute Rehabilitation Services  Office:  [phone]   Metta Clines 10/27/2020, 9:25 AM

## 2020-10-28 ENCOUNTER — Encounter (HOSPITAL_COMMUNITY): Payer: Self-pay | Admitting: Orthopedic Surgery

## 2020-10-28 NOTE — Progress Notes (Signed)
Occupational Therapy Treatment Patient Details Name: Joanna Sweeney MRN: [id number] DOB: 10-11-1960 Today's Date: 10/28/2020    History of present illness 60 y.o. female presenting with Degenerative disc disease with herniated disc L5-S1.  Underwent ANTERIOR LUMBAR FUSION L5-S1, LEFT ILIAC CREST BONE GRAFT, POSTERIOR SPINAL FUSION INTERBODY L5-S1.   OT comments  Patient remains sore to L hip and incision site, but is Mod I with all ADL from sit/stand level, and Mod I for functional mobility in her room.  She is preparing to discharge home this afternoon, and no further OT is needed in the acute, or post acute setting.  All questions answered, and good understanding of all back precautions.    Follow Up Recommendations  No OT follow up    Equipment Recommendations  Tub/shower seat    Recommendations for Other Services      Precautions / Restrictions Precautions Precautions: Back Precaution Booklet Issued: Yes (comment) Precaution Comments: Reviewed back precautions Required Braces or Orthoses: Spinal Brace Spinal Brace: Applied in sitting position;Lumbar corset Restrictions Weight Bearing Restrictions: No Other Position/Activity Restrictions: L hip pain from bone grafting       Mobility Bed Mobility Overal bed mobility: Modified Independent Bed Mobility: Rolling;Sidelying to Sit;Sit to Sidelying Rolling: Modified independent (Device/Increase time) Sidelying to sit: Modified independent (Device/Increase time)     Sit to sidelying: Modified independent (Device/Increase time) General bed mobility comments: HOB flat and no use of rails to simulate home; bed height elevated as well, good demo of log roll technique.    Transfers Overall transfer level: Needs assistance Equipment used: Rolling walker (2 wheeled) Transfers: Sit to/from Stand Sit to Stand: Supervision         General transfer comment: Supervision for safety. Pt pulling up on RW to stand.    Balance Overall  balance assessment: No apparent balance deficits (not formally assessed) Sitting-balance support: No upper extremity supported;Feet supported Sitting balance-[name] Scale: Good     Standing balance support: During functional activity Standing balance-[name] Scale: Fair Standing balance comment: Prefers UE support for dynamic tasks but able to manuever things on tray without UE support for short periods.                           ADL either performed or assessed with clinical judgement   ADL Overall ADL's : Modified independent                                                               Cognition Arousal/Alertness: Awake/alert Behavior During Therapy: WFL for tasks assessed/performed Overall Cognitive Status: Within Functional Limits for tasks assessed                                                      General Comments Bandage incision clean dry and intact.    Pertinent Vitals/ Pain       Pain Assessment: Faces Faces Pain Scale: Hurts little more Pain Location: Lft hip/pelvis Pain Descriptors / Indicators: Grimacing;Operative site guarding;Sore Pain Intervention(s): Monitored during session;Repositioned  Frequency           Progress Toward Goals  OT Goals(current goals can now be found in the care plan section)  Progress towards OT goals: Progressing toward goals  Acute Rehab OT Goals Patient Stated Goal: Ready to discharge home OT Goal Formulation: With patient Time For Goal Achievement: 11/10/20  Plan      Co-evaluation                 AM-PAC OT "6 Clicks" Daily Activity     Outcome Measure   Help from another person eating meals?: None Help from another person taking care of personal grooming?: None Help from another person toileting, which includes using toliet, bedpan, or urinal?: None Help from another  person bathing (including washing, rinsing, drying)?: None Help from another person to put on and taking off regular upper body clothing?: None Help from another person to put on and taking off regular lower body clothing?: None 6 Click Score: 24    End of Session    OT Visit Diagnosis: Pain Pain - Right/Left: Left Pain - part of body: Hip   Activity Tolerance Patient tolerated treatment well   Patient Left in bed;with call bell/phone within reach   Nurse Communication          Time: 0903-0920 OT Time Calculation (min): 17 min  Charges: OT General Charges $OT Visit: 1 Visit OT Treatments $Self Care/Home Management : 8-22 mins  10/28/2020  Rich, OTR/L  Acute Rehabilitation Services  Office:  [phone]    Metta Clines 10/28/2020, 9:32 AM

## 2020-10-28 NOTE — Progress Notes (Signed)
Physical Therapy Treatment Patient Details Name: Joanna Sweeney MRN: [id number] DOB: [birth year] Today's Date: 10/28/2020    History of Present Illness 60 y.o. female presenting with Degenerative disc disease with herniated disc L5-S1.  Underwent ANTERIOR LUMBAR FUSION L5-S1, LEFT ILIAC CREST BONE GRAFT, POSTERIOR SPINAL FUSION INTERBODY L5-S1.    PT Comments    Patient progressing well towards PT goals. Reports continued pain in left hip and heaviness which loosens up and improves with mobility. Able to recall and adhere to back precautions during session. Tolerated gait training and stair training with supervision for safety. Reviewed back precautions, log roll technique, stair training, car transfers, positioning, activity recommendations etc. Will continue to follow and progress as tolerated.    Follow Up Recommendations  No PT follow up;Supervision - Intermittent     Equipment Recommendations  Rolling walker with 5" wheels;3in1 (PT)    Recommendations for Other Services       Precautions / Restrictions Precautions Precautions: Back Precaution Booklet Issued: Yes (comment) Precaution Comments: Reviewed back precautions Required Braces or Orthoses: Spinal Brace Spinal Brace: Applied in sitting position;Lumbar corset Restrictions Weight Bearing Restrictions: No Other Position/Activity Restrictions: L hip pain from bone grafting    Mobility  Bed Mobility Overal bed mobility: Needs Assistance Bed Mobility: Rolling;Sidelying to Sit;Sit to Sidelying Rolling: Modified independent (Device/Increase time) Sidelying to sit: Modified independent (Device/Increase time)     Sit to sidelying: Modified independent (Device/Increase time) General bed mobility comments: HOB flat and no use of rails to simulate home; bed height elevated as well, good demo of log roll technique.    Transfers Overall transfer level: Needs assistance Equipment used: Rolling walker (2 wheeled) Transfers:  Sit to/from Stand Sit to Stand: Supervision         General transfer comment: Supervision for safety. Pt pulling up on RW to stand.  Ambulation/Gait Ambulation/Gait assistance: Supervision Gait Distance (Feet): 200 Feet Assistive device: Rolling walker (2 wheeled) Gait Pattern/deviations: Step-through pattern;Decreased stride length;Trunk flexed Gait velocity: Decreased   General Gait Details: Slow, guarded and mostly steady gait with decreased knee flexion RLE during swing phase; LLE loosened up during gait.   Stairs Stairs: Yes Stairs assistance: Supervision Stair Management: One rail Right;Step to pattern;Forwards Number of Stairs: 13 General stair comments: Cues for safety.   Wheelchair Mobility    Modified Rankin (Stroke Patients Only)       Balance Overall balance assessment: Needs assistance Sitting-balance support: No upper extremity supported;Feet supported Sitting balance-[name] Scale: Good     Standing balance support: During functional activity Standing balance-[name] Scale: Fair Standing balance comment: Prefers UE support for dynamic tasks but able to manuever things on tray without UE support for short periods.                            Cognition Arousal/Alertness: Awake/alert Behavior During Therapy: WFL for tasks assessed/performed Overall Cognitive Status: Within Functional Limits for tasks assessed                                        Exercises      General Comments General comments (skin integrity, edema, etc.): Bandage incision clean dry and intact.      Pertinent Vitals/Pain Pain Assessment: Faces Faces Pain Scale: Hurts little more Pain Location: Lft hip/pelvis Pain Descriptors / Indicators: Grimacing;Operative site guarding;Sore Pain Intervention(s): Monitored during session;Repositioned  Home Living                      Prior Function            PT Goals (current goals can now be  found in the care plan section) Progress towards PT goals: Progressing toward goals    Frequency    Min 5X/week      PT Plan Current plan remains appropriate    Co-evaluation              AM-PAC PT "6 Clicks" Mobility   Outcome Measure  Help needed turning from your back to your side while in a flat bed without using bedrails?: None Help needed moving from lying on your back to sitting on the side of a flat bed without using bedrails?: None Help needed moving to and from a bed to a chair (including a wheelchair)?: None Help needed standing up from a chair using your arms (e.g., wheelchair or bedside chair)?: None Help needed to walk in hospital room?: None Help needed climbing 3-5 steps with a railing? : A Little 6 Click Score: 23    End of Session Equipment Utilized During Treatment: Back brace Activity Tolerance: Patient tolerated treatment well Patient left: in bed;with call bell/phone within reach Nurse Communication: Mobility status PT Visit Diagnosis: Unsteadiness on feet (R26.81);Pain Pain - Right/Left: Left Pain - part of body: Hip     Time: 0737-0800 PT Time Calculation (min) (ACUTE ONLY): 23 min  Charges:  $Gait Training: 8-22 mins $Therapeutic Activity: 8-22 mins                     Marisa Severin, PT, DPT Acute Rehabilitation Services Pager [phone] Office [place] 10/28/2020, 8:43 AM

## 2020-10-28 NOTE — Plan of Care (Signed)
Patient is discharged from room 3C10 at this time. Alert and in stable condition. IV site d/c'd and instructions read to patient with understanding verbalized and all questions answered. Left unit via wheelchair with all belongings at side.  

## 2020-10-28 NOTE — Progress Notes (Signed)
    Subjective: Procedure(s) (LRB): ANTERIOR LUMBAR FUSION L5-S1, LEFT ILIAC CREST BONE GRAFT, POSTERIOR SPINAL FUSION INTERBODY L5-S1 (N/A) ABDOMINAL EXPOSURE (N/A) 2 Days Post-Op  Patient reports pain as 3 on 0-10 scale.  Reports decreased leg pain -she does complain of left groin/anterior thigh pain reports incisional back pain   Positive void Negative bowel movement Positive flatus Negative chest pain or shortness of breath  Objective: Vital signs in last 24 hours: Temp:  [98.2 F (36.8 C)-99.5 F (37.5 C)] 98.2 F (36.8 C) (02/25 0540) Pulse Rate:  [66-87] 76 (02/25 0540) Resp:  [17-20] 20 (02/25 0540) BP: (102-117)/(61-74) 110/74 (02/25 0540) SpO2:  [91 %-96 %] 93 % (02/25 0540)  Intake/Output from previous day: No intake/output data recorded.  Labs: Recent Labs    10/26/20 1604  WBC 12.6*  RBC 3.66*  HCT 34.6*  PLT 260   Recent Labs    10/26/20 1604  CREATININE 0.99   No results for input(s): LABPT, INR in the last 72 hours.  Physical Exam: Neurologically intact ABD soft Intact pulses distally Incision: dressing C/D/I and no drainage Compartment soft Body mass index is 33.23 kg/m.   Assessment/Plan: Patient stable  Doppler exam was negative for DVT. Continue mobilization with physical therapy Continue care  1.  Patient was seen yesterday by me however I inadvertently omitted to place my progress note.  Patient was doing well yesterday.  The pain that she had on the evening of postoperative day 0 was adequately managed with Dilaudid.  That was stopped and she is now just on oral medications. 2.  Patient does complain of anterior left groin pain.  I did explain to her that that is expected given the iliopsoas retraction that occurs during the surgery.  She is ambulating without gait disturbance.  She is able to do stairs and she is walked the length of the hallway. 3.  Overall patient is doing well.  Plan on discharge to home.  She will continue  Lovenox for another 10 days and then transition to aspirin. 4.  I have given her prescriptions for Percocet, Zofran, and Robaxin. 5.  She will perform gentle range of motion exercises at home.  If there is any issues problems or concerns she knows to contact me.  Melina Schools, MD [facility] [phone]

## 2020-10-31 MED FILL — Heparin Sodium (Porcine) Inj 1000 Unit/ML: INTRAMUSCULAR | Qty: 30 | Status: AC

## 2020-10-31 MED FILL — Sodium Chloride IV Soln 0.9%: INTRAVENOUS | Qty: 1000 | Status: AC

## 2020-11-01 NOTE — Discharge Summary (Signed)
Patient ID: Joanna Sweeney MRN: [id number] DOB/AGE: 1961-09-01 60 y.o.  Admit date: 10/26/2020 Discharge date: 11/01/2020  Admission Diagnoses:  Active Problems:   S/P lumbar fusion   Discharge Diagnoses:  Active Problems:   S/P lumbar fusion  status post Procedure(s): ANTERIOR LUMBAR FUSION L5-S1, LEFT ILIAC CREST BONE GRAFT, POSTERIOR SPINAL FUSION INTERBODY L5-S1 ABDOMINAL EXPOSURE  Past Medical History:  Diagnosis Date  . Asthma   . Bipolar disorder ([place])   . Cervical radiculopathy   . Chronic ankle pain   . Degeneration of lumbar intervertebral disc   . Depression   . Hypertension   . Lumbar radiculopathy   . Lumbar spine pain   . Muscle atrophy   . Pain in right foot   . Pain in right knee     Surgeries: Procedure(s): ANTERIOR LUMBAR FUSION L5-S1, LEFT ILIAC CREST BONE GRAFT, POSTERIOR SPINAL FUSION INTERBODY L5-S1 ABDOMINAL EXPOSURE on 10/26/2020   Consultants:   Discharged Condition: Improved  Hospital Course: Joanna Sweeney is an 60 y.o. female who was admitted 10/26/2020 for operative treatment of Degenerative disc disease with herniated disc L5-S1. Patient failed conservative treatments (please see the history and physical for the specifics) and had severe unremitting pain that affects sleep, daily activities and work/hobbies. After pre-op clearance, the patient was taken to the operating room on 10/26/2020 and underwent  Procedure(s): ANTERIOR LUMBAR FUSION L5-S1, LEFT ILIAC CREST BONE GRAFT, POSTERIOR SPINAL FUSION INTERBODY L5-S1 ABDOMINAL EXPOSURE.    Patient was given perioperative antibiotics:  Anti-infectives (From admission, onward)   Start     Dose/Rate Route Frequency Ordered Stop   10/26/20 1615  ceFAZolin (ANCEF) IVPB 1 g/50 mL premix        1 g 100 mL/hr over 30 Minutes Intravenous Every 8 hours 10/26/20 1526 10/26/20 2353   10/26/20 0631  ceFAZolin (ANCEF) IVPB 2g/100 mL premix        2 g 200 mL/hr over 30 Minutes Intravenous 30 min pre-op  10/26/20 0631 10/26/20 1215       Patient was given sequential compression devices and early ambulation to prevent DVT.   Patient benefited maximally from hospital stay and there were no complications. At the time of discharge, the patient was urinating/moving their bowels without difficulty, tolerating a regular diet, pain is controlled with oral pain medications and they have been cleared by PT/OT.   Recent vital signs: No data found.   Recent laboratory studies: No results for input(s): WBC, HGB, HCT, PLT, NA, K, CL, CO2, BUN, CREATININE, GLUCOSE, INR, CALCIUM in the last 72 hours.  Invalid input(s): PT, 2   Discharge Medications:   Allergies as of 10/28/2020   No Known Allergies     Medication List    STOP taking these medications   amoxicillin 500 MG tablet Commonly known as: AMOXIL   buPROPion 150 MG 24 hr tablet Commonly known as: WELLBUTRIN XL   diazepam 5 MG tablet Commonly known as: VALIUM   diclofenac 75 MG EC tablet Commonly known as: VOLTAREN   gabapentin 300 MG capsule Commonly known as: NEURONTIN   hydrochlorothiazide 25 MG tablet Commonly known as: HYDRODIURIL   lamoTRIgine 100 MG tablet Commonly known as: LAMICTAL   lidocaine 5 % Commonly known as: LIDODERM   methocarbamol 500 MG tablet Commonly known as: ROBAXIN   misoprostol 200 MCG tablet Commonly known as: CYTOTEC   promethazine 25 MG tablet Commonly known as: PHENERGAN   Qsymia 7.5-46 MG Cp24 Generic drug: Phentermine-Topiramate   Thera Tabs  Vitamin D (Ergocalciferol) 1.25 MG (50000 UNIT) Caps capsule Commonly known as: DRISDOL     TAKE these medications   albuterol 0.63 MG/3ML nebulizer solution Commonly known as: ACCUNEB Take 1 ampule by nebulization every 6 (six) hours as needed for wheezing.   albuterol 108 (90 Base) MCG/ACT inhaler Commonly known as: VENTOLIN HFA Inhale 1-2 puffs into the lungs every 6 (six) hours as needed for wheezing or shortness of breath.    budesonide-formoterol 80-4.5 MCG/ACT inhaler Commonly known as: SYMBICORT Inhale 2 puffs into the lungs 2 (two) times daily.   cloNIDine 0.1 MG tablet Commonly known as: CATAPRES Take 0.2 mg by mouth at bedtime.   doxepin 10 MG capsule Commonly known as: SINEQUAN Take 10 mg by mouth.   enoxaparin 40 MG/0.4ML injection Commonly known as: LOVENOX Inject 0.4 mLs (40 mg total) into the skin daily for 10 days. 10 day supply 1 injection per day   Melatonin 10 MG Tabs Take 10 mg by mouth at bedtime as needed (sleep).   ondansetron 4 MG tablet Commonly known as: Zofran Take 1 tablet (4 mg total) by mouth every 8 (eight) hours as needed for nausea or vomiting.   sertraline 100 MG tablet Commonly known as: ZOLOFT Take 100 mg by mouth daily.   traZODone 100 MG tablet Commonly known as: DESYREL Take 100 mg by mouth at bedtime.     ASK your doctor about these medications   methocarbamol 500 MG tablet Commonly known as: Robaxin Take 1 tablet (500 mg total) by mouth every 8 (eight) hours as needed for up to 5 days for muscle spasms. Ask about: Should I take this medication?   oxyCODONE-acetaminophen 10-325 MG tablet Commonly known as: Percocet Take 1 tablet by mouth every 6 (six) hours as needed for up to 5 days for pain. Ask about: Should I take this medication?       Diagnostic Studies: DG Chest 2 View  Result Date: 10/25/2020 CLINICAL DATA:  Preop.  Preop back surgery. EXAM: CHEST - 2 VIEW COMPARISON:  None. FINDINGS: Upper normal heart size.The cardiomediastinal contours are normal. The lungs are clear. Pulmonary vasculature is normal. No consolidation, pleural effusion, or pneumothorax. No acute osseous abnormalities are seen. IMPRESSION: Upper normal heart size. No acute chest findings. Electronically Signed   By: Keith Rake M.D.   On: 10/25/2020 11:55   DG Lumbar Spine 2-3 Views  Result Date: 10/26/2020 CLINICAL DATA:  Surgery, elective. Additional history  provided: Anterior lumbar fusion L5-S1, left iliac crest bone graft, posterior spinal fusion interbody L5-S1. Provided fluoroscopy time 2 minutes, 59 seconds (141.56 mGy). EXAM: LUMBAR SPINE - 2-3 VIEW; DG C-ARM 1-60 MIN COMPARISON:  Lumbar spine MRI 10/20/2019. FINDINGS: AP and lateral intraoperative fluoroscopic images of the lumbosacral spine are submitted, 4 images total. The lowest well-formed intervertebral disc space is designated L5-S1. The images demonstrate an anterior approach interbody fusion device at L5-S1. Additionally, there is a posterior spinal fusion construct at L5-S1 (bilateral pedicle screws and vertical interconnecting rods). No unexpected finding. IMPRESSION: Four intraoperative fluoroscopic images of the lumbosacral spine from L5-S1 fusion, as described. Electronically Signed   By: Kellie Simmering DO   On: 10/26/2020 13:17   DG C-Arm 1-60 Min  Result Date: 10/26/2020 CLINICAL DATA:  Surgery, elective. Additional history provided: Anterior lumbar fusion L5-S1, left iliac crest bone graft, posterior spinal fusion interbody L5-S1. Provided fluoroscopy time 2 minutes, 59 seconds (141.56 mGy). EXAM: LUMBAR SPINE - 2-3 VIEW; DG C-ARM 1-60 MIN COMPARISON:  Lumbar  spine MRI 10/20/2019. FINDINGS: AP and lateral intraoperative fluoroscopic images of the lumbosacral spine are submitted, 4 images total. The lowest well-formed intervertebral disc space is designated L5-S1. The images demonstrate an anterior approach interbody fusion device at L5-S1. Additionally, there is a posterior spinal fusion construct at L5-S1 (bilateral pedicle screws and vertical interconnecting rods). No unexpected finding. IMPRESSION: Four intraoperative fluoroscopic images of the lumbosacral spine from L5-S1 fusion, as described. Electronically Signed   By: Kellie Simmering DO   On: 10/26/2020 13:17   VAS [place] LOWER EXTREMITY VENOUS (DVT)  Result Date: 10/27/2020  Lower Venous DVT Study Indications: Post operative anterior  exposure L/5 S/1 Disc fusion.  Comparison Study: No prior study Performing Technologist: Sharion Dove RVS  Examination Guidelines: A complete evaluation includes B-mode imaging, spectral Doppler, color Doppler, and power Doppler as needed of all accessible portions of each vessel. Bilateral testing is considered an integral part of a complete examination. Limited examinations for reoccurring indications may be performed as noted. The reflux portion of the exam is performed with the patient in reverse Trendelenburg.  +---------+---------------+---------+-----------+----------+--------------+ RIGHT    CompressibilityPhasicitySpontaneityPropertiesThrombus Aging +---------+---------------+---------+-----------+----------+--------------+ CFV      Full           Yes      Yes                                 +---------+---------------+---------+-----------+----------+--------------+ SFJ      Full                                                        +---------+---------------+---------+-----------+----------+--------------+ FV Prox  Full                                                        +---------+---------------+---------+-----------+----------+--------------+ FV Mid   Full                                                        +---------+---------------+---------+-----------+----------+--------------+ FV DistalFull                                                        +---------+---------------+---------+-----------+----------+--------------+ PFV      Full                                                        +---------+---------------+---------+-----------+----------+--------------+ POP      Full           Yes      Yes                                 +---------+---------------+---------+-----------+----------+--------------+  PTV      Full                                                         +---------+---------------+---------+-----------+----------+--------------+ PERO     Full                                                        +---------+---------------+---------+-----------+----------+--------------+   +---------+---------------+---------+-----------+----------+--------------+ LEFT     CompressibilityPhasicitySpontaneityPropertiesThrombus Aging +---------+---------------+---------+-----------+----------+--------------+ CFV      Full           Yes      Yes                                 +---------+---------------+---------+-----------+----------+--------------+ SFJ      Full                                                        +---------+---------------+---------+-----------+----------+--------------+ FV Prox  Full                                                        +---------+---------------+---------+-----------+----------+--------------+ FV Mid   Full                                                        +---------+---------------+---------+-----------+----------+--------------+ FV DistalFull                                                        +---------+---------------+---------+-----------+----------+--------------+ PFV      Full                                                        +---------+---------------+---------+-----------+----------+--------------+ POP      Full           Yes      Yes                                 +---------+---------------+---------+-----------+----------+--------------+ PTV      Full                                                        +---------+---------------+---------+-----------+----------+--------------+  PERO     Full                                                        +---------+---------------+---------+-----------+----------+--------------+     Summary: BILATERAL: - No evidence of deep vein thrombosis seen in the lower extremities, bilaterally. -  LEFT: - A  cystic structure is found in the popliteal fossa.  *See table(s) above for measurements and observations. Electronically signed by Jamelle Haring on 10/27/2020 at 8:12:21 PM.    Final    DG OR LOCAL ABDOMEN  Result Date: 10/26/2020 CLINICAL DATA:  Post L5-S1 ALIF question retained foreign body EXAM: OR LOCAL ABDOMEN COMPARISON:  Portable exam 1049 hours. FINDINGS: Orthopedic hardware L5-S1. Single metallic surgical clip is seen projecting over sacrum RIGHT of midline. No other radiopaque foreign bodies at the operative bed. Tiny metallic foreign body projects over the soft tissues lateral to the LEFT pelvis. IMPRESSION: Single surgical clip projecting over sacrum. No other intrapelvic radiopaque foreign bodies identified. Tiny metallic foreign body in the soft tissues lateral to the LEFT pelvis. Findings called to [facility] in OR 4 on 10/26/2020 at 1105 hours. Electronically Signed   By: Lavonia Dana M.D.   On: 10/26/2020 11:06    Discharge Instructions    Incentive spirometry RT   Complete by: As directed        Follow-up Information    Melina Schools, MD. Schedule an appointment as soon as possible for a visit in 2 weeks.   Specialty: Orthopedic Surgery Why: If symptoms worsen, For suture removal, For wound re-check Contact information: [address] [place] [address] [phone]               Discharge Plan:  discharge to home  Disposition: stable    Signed: Yvonne Kendall Ward for [facility] PA-C [facility] [phone] 11/01/2020, 8:34 AM

## 2022-04-05 IMAGING — CR DG OR LOCAL ABDOMEN
1 series · 1 of 1 positions shown · non-contrast
Comparison: Portable exam 0687 hours.

CLINICAL DATA: Post L5-S1 ALIF question retained foreign body

EXAM:
OR LOCAL ABDOMEN

[AP]
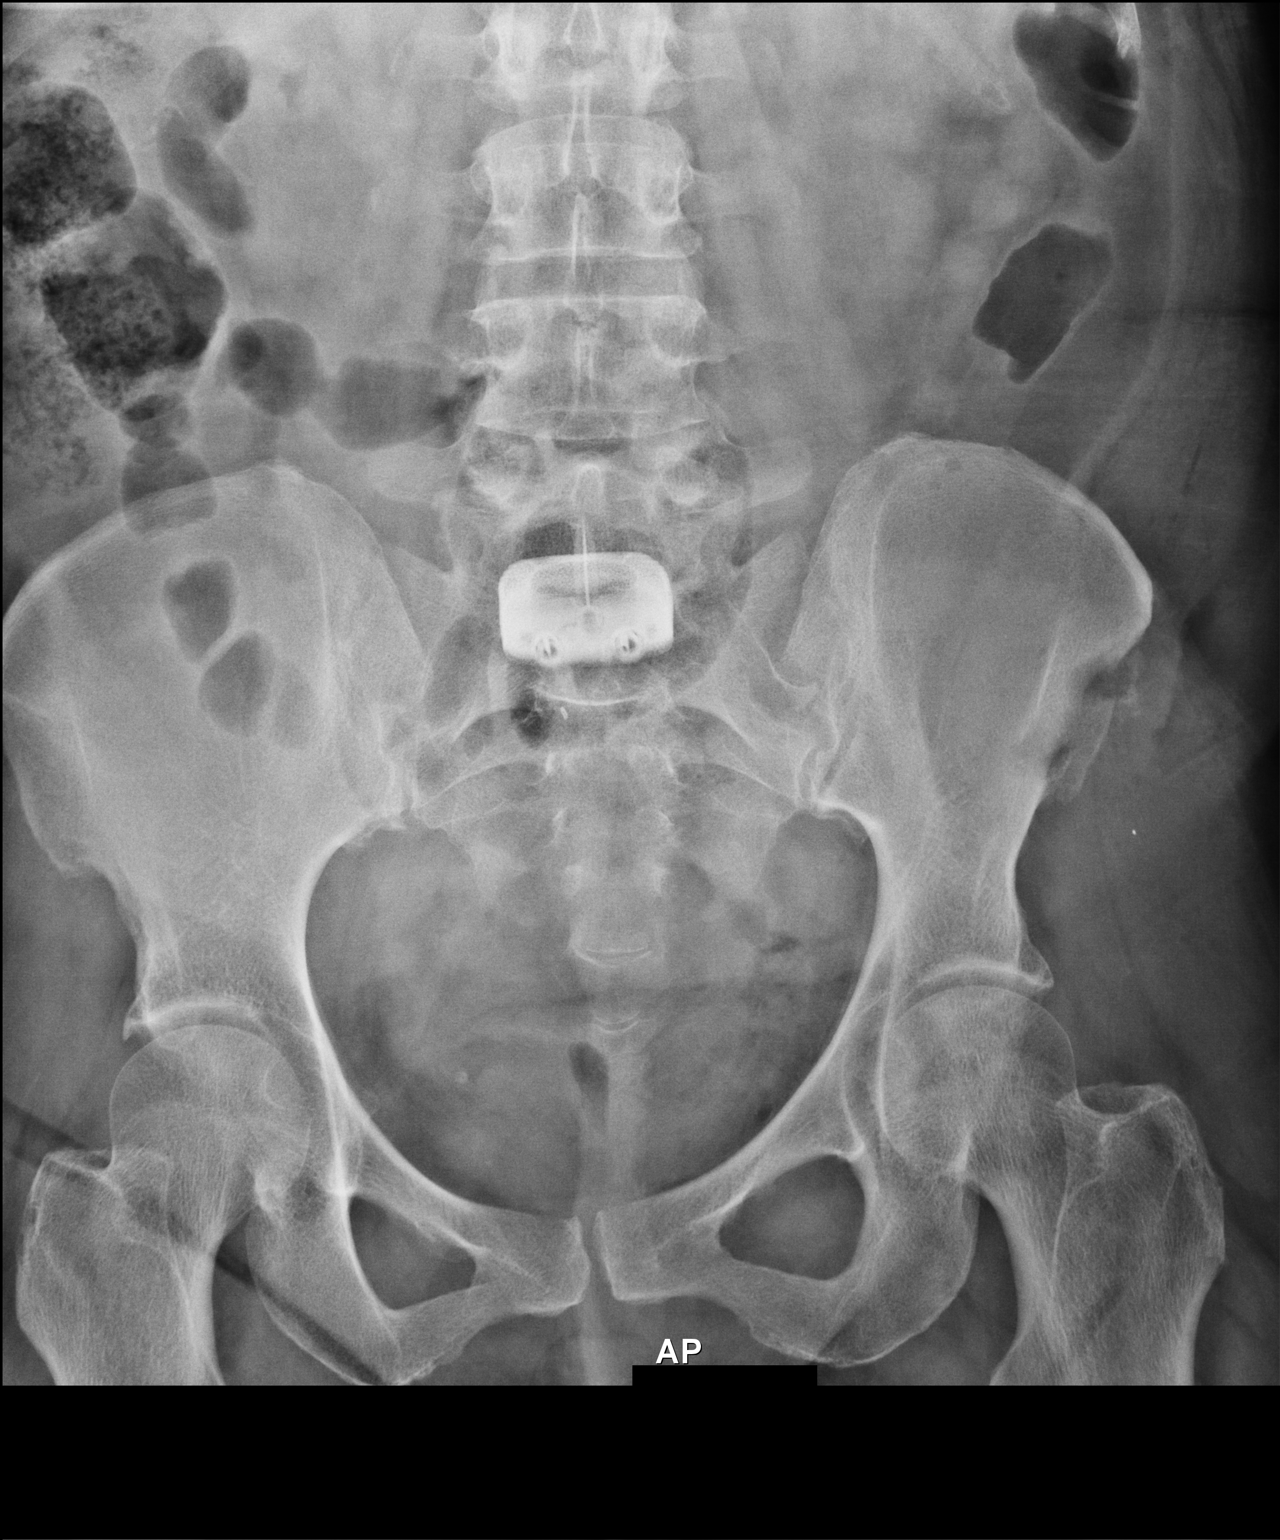

[1 of 1 positions shown; findings below may reference images not displayed]

FINDINGS: Orthopedic hardware L5-S1.

Single metallic surgical clip is seen projecting over sacrum RIGHT
of midline.

No other radiopaque foreign bodies at the operative bed.

Tiny metallic foreign body projects over the soft tissues lateral to
the LEFT pelvis.
IMPRESSION: Single surgical clip projecting over sacrum.

No other intrapelvic radiopaque foreign bodies identified.

Tiny metallic foreign body in the soft tissues lateral to the LEFT
pelvis.

Findings called to [facility] in OR 4 on 10/26/2020 at 9997 hours.

## 2022-04-05 IMAGING — RF DG C-ARM 1-60 MIN
1 series · 4 of 4 positions shown · non-contrast
Comparison: Lumbar spine MRI 10/20/2019.

CLINICAL DATA: Surgery, elective. Additional history provided:
Anterior lumbar fusion L5-S1, left iliac crest bone graft, posterior
spinal fusion interbody L5-S1. Provided fluoroscopy time 2 minutes,
59 seconds (141.56 mGy).

EXAM:
LUMBAR SPINE - 2-3 VIEW; DG C-ARM 1-60 MIN

[Series 1: run · 4 of 4 slices shown]
[im 1/4]
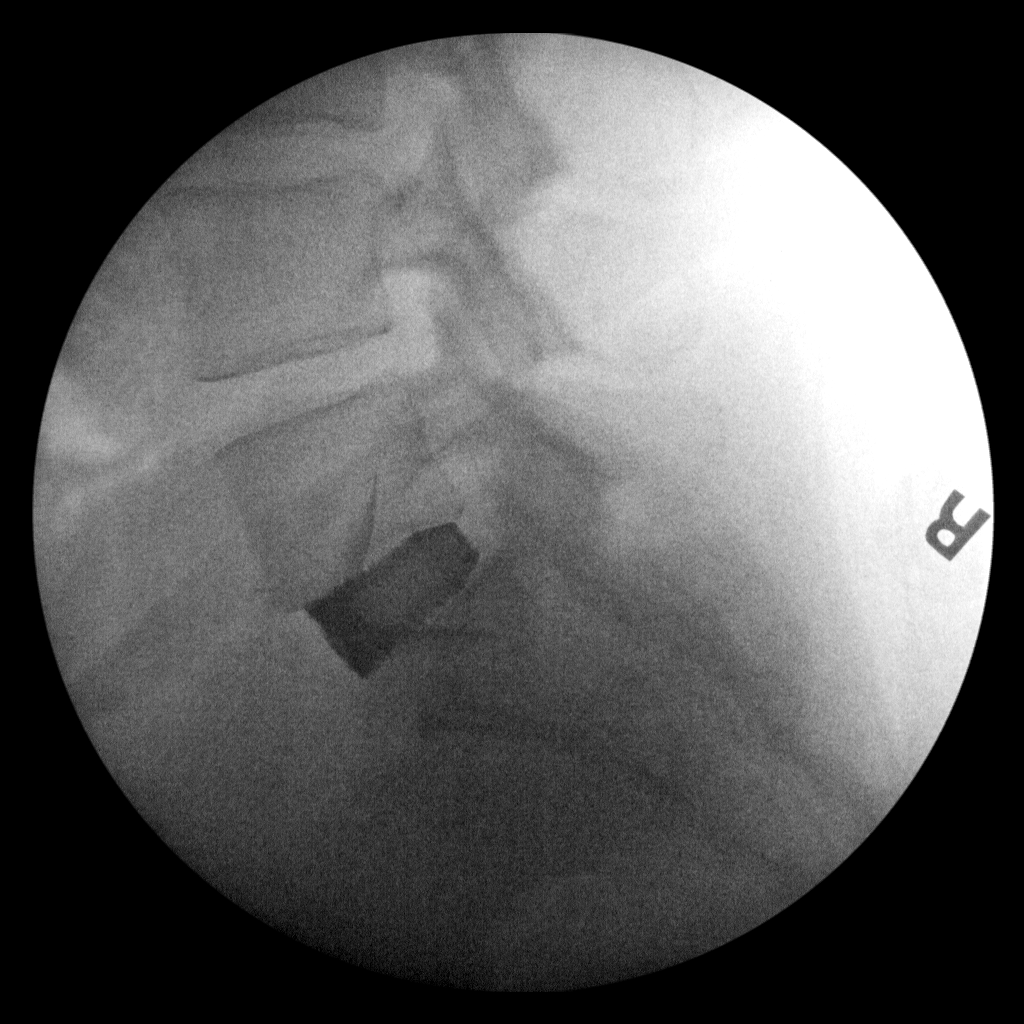
[im 2/4]
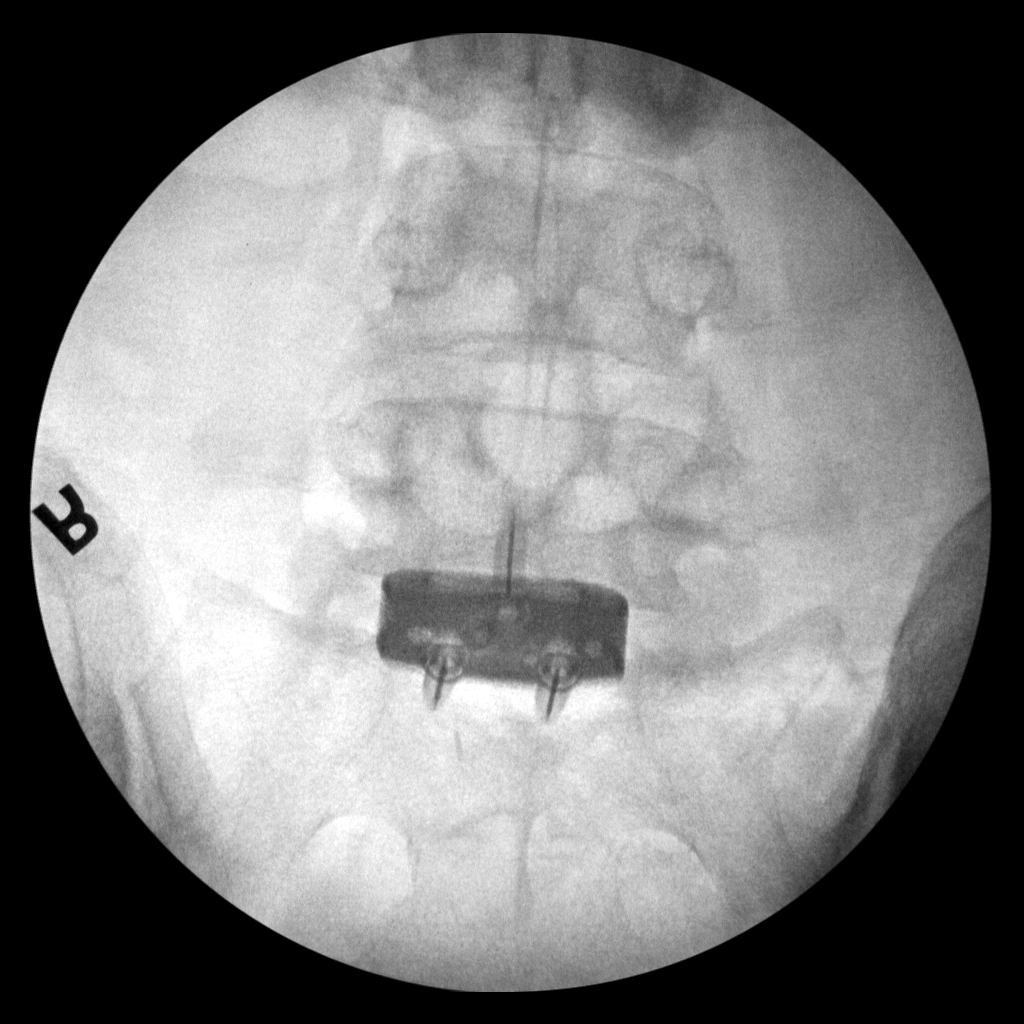
[im 3/4]
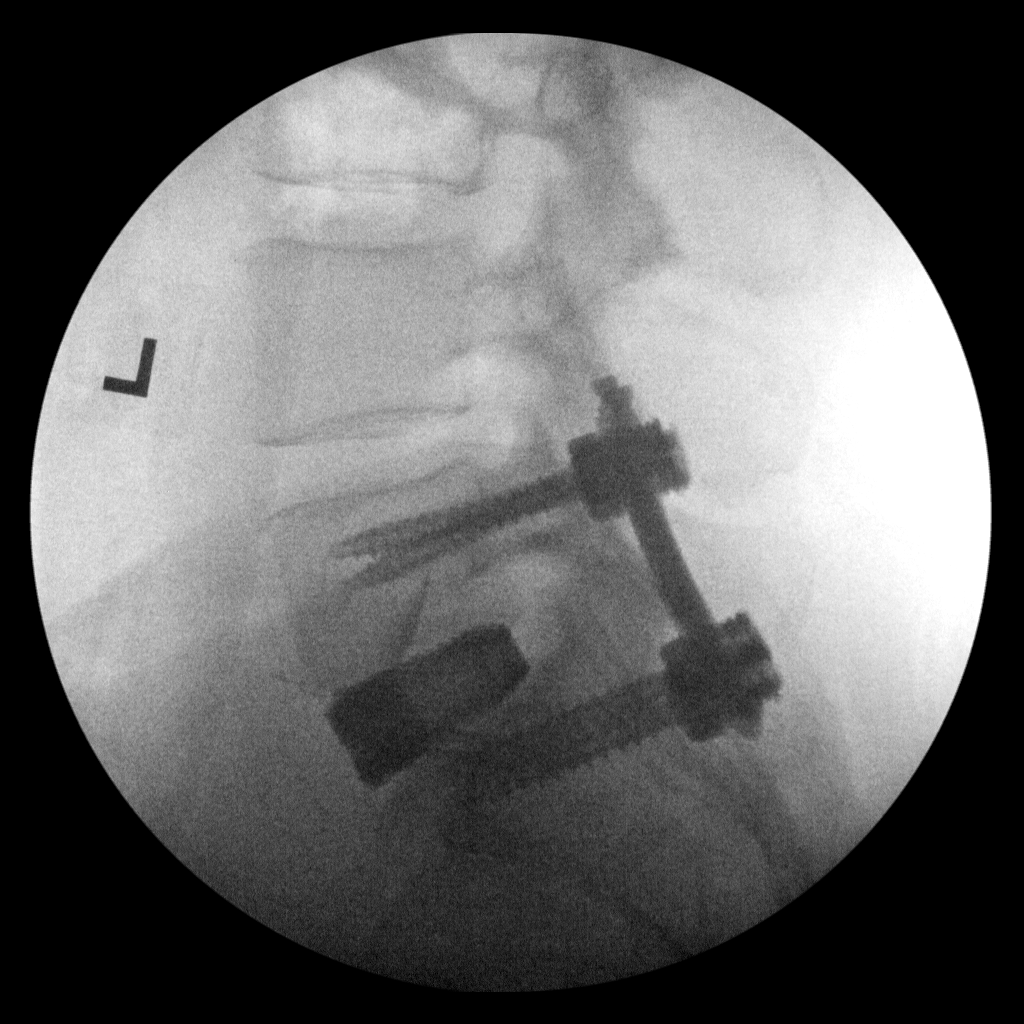
[im 4/4]
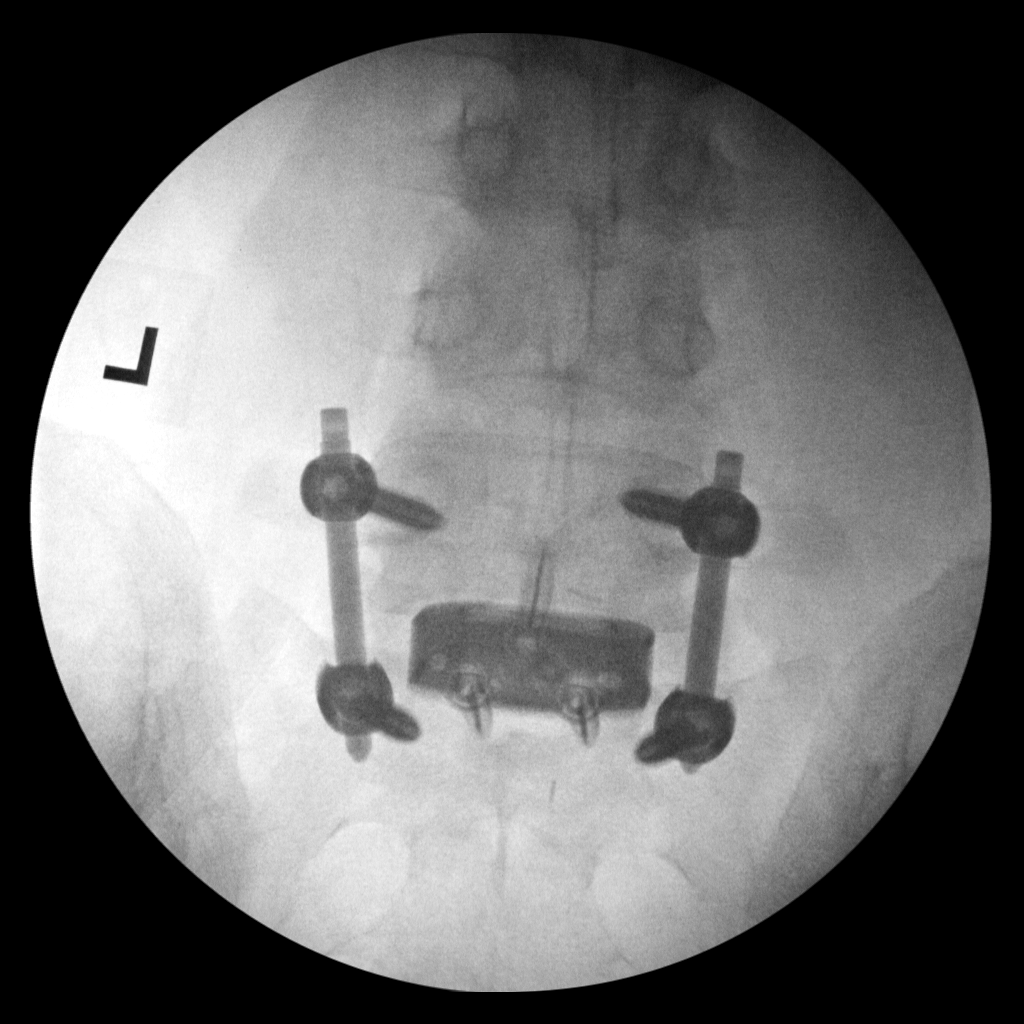

[4 of 4 positions shown; findings below may reference images not displayed]

FINDINGS: AP and lateral intraoperative fluoroscopic images of the lumbosacral
spine are submitted, 4 images total. The lowest well-formed
intervertebral disc space is designated L5-S1. The images
demonstrate an anterior approach interbody fusion device at L5-S1.
Additionally, there is a posterior spinal fusion construct at L5-S1
(bilateral pedicle screws and vertical interconnecting rods). No
unexpected finding.
IMPRESSION: Four intraoperative fluoroscopic images of the lumbosacral spine
from L5-S1 fusion, as described.

## 2022-04-05 IMAGING — RF DG LUMBAR SPINE 2-3V
1 series · 4 of 4 positions shown · non-contrast
Comparison: Lumbar spine MRI 10/20/2019.

CLINICAL DATA: Surgery, elective. Additional history provided:
Anterior lumbar fusion L5-S1, left iliac crest bone graft, posterior
spinal fusion interbody L5-S1. Provided fluoroscopy time 2 minutes,
59 seconds (141.56 mGy).

EXAM:
LUMBAR SPINE - 2-3 VIEW; DG C-ARM 1-60 MIN

[Series 1: run · 4 of 4 slices shown]
[im 1/4]
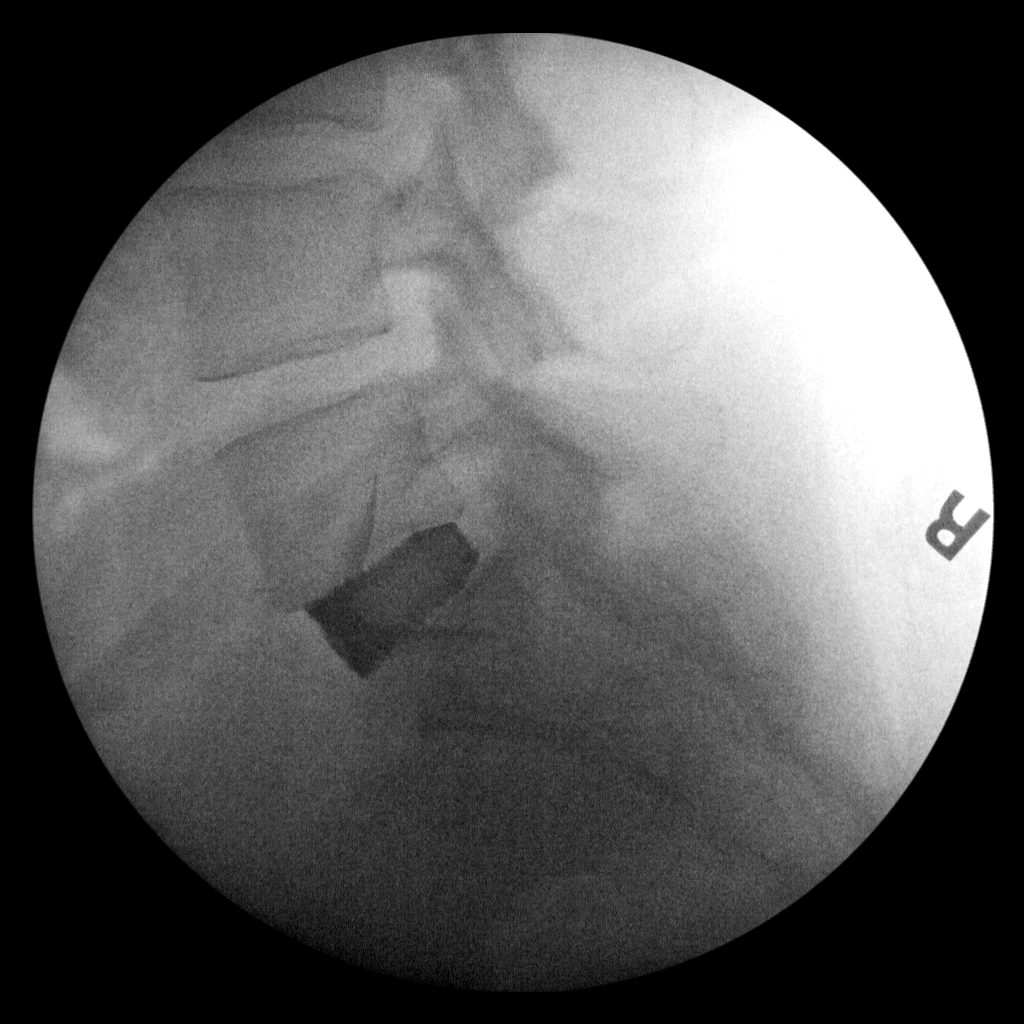
[im 2/4]
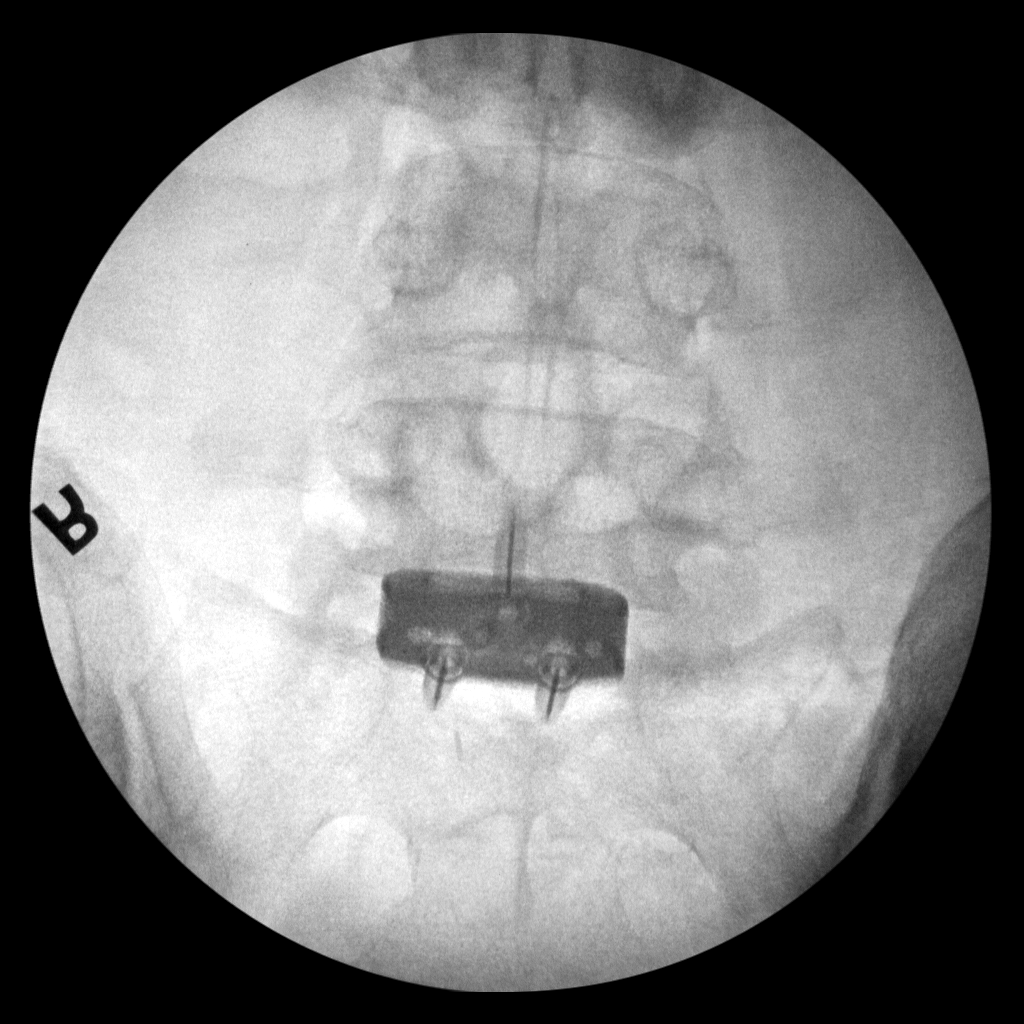
[im 3/4]
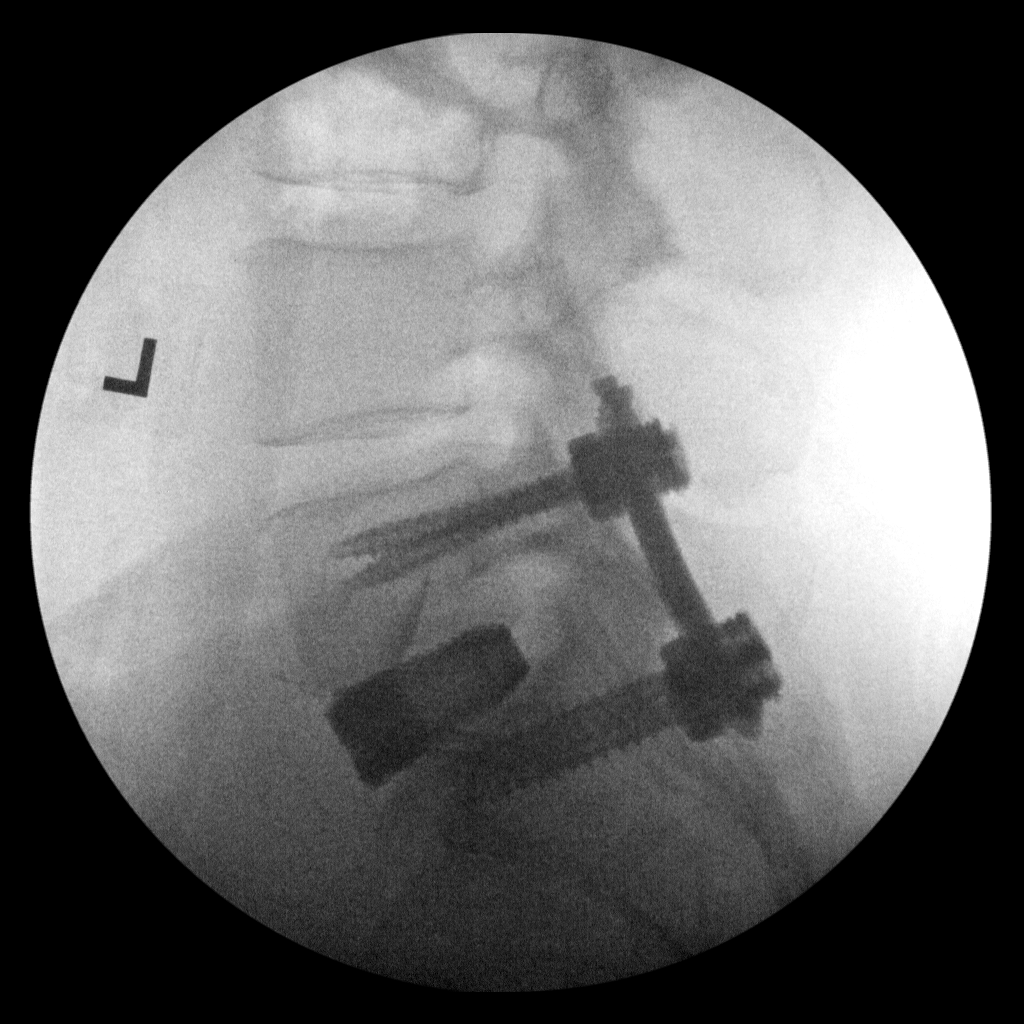
[im 4/4]
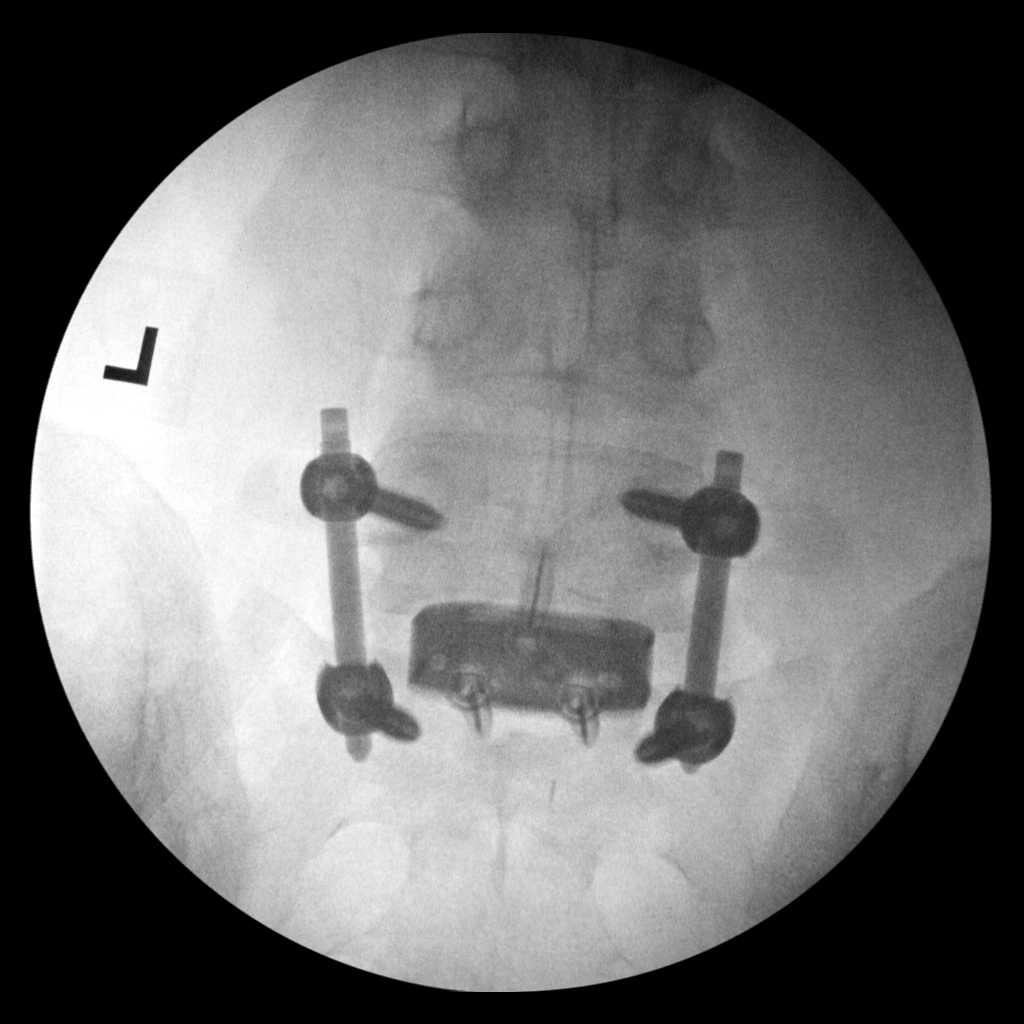

[4 of 4 positions shown; findings below may reference images not displayed]

FINDINGS: AP and lateral intraoperative fluoroscopic images of the lumbosacral
spine are submitted, 4 images total. The lowest well-formed
intervertebral disc space is designated L5-S1. The images
demonstrate an anterior approach interbody fusion device at L5-S1.
Additionally, there is a posterior spinal fusion construct at L5-S1
(bilateral pedicle screws and vertical interconnecting rods). No
unexpected finding.
IMPRESSION: Four intraoperative fluoroscopic images of the lumbosacral spine
from L5-S1 fusion, as described.
